# Patient Record
Sex: Female | Born: 1955 | Race: White | Hispanic: No | Marital: Married | Smoking: Never smoker
Health system: Southern US, Community
[De-identification: ages and names within clinical notes are randomized; demographics above are authoritative.]

## PROBLEM LIST (undated history)

## (undated) DIAGNOSIS — C50919 Malignant neoplasm of unspecified site of unspecified female breast: Secondary | ICD-10-CM

## (undated) DIAGNOSIS — I499 Cardiac arrhythmia, unspecified: Secondary | ICD-10-CM

## (undated) DIAGNOSIS — E039 Hypothyroidism, unspecified: Secondary | ICD-10-CM

## (undated) DIAGNOSIS — Z923 Personal history of irradiation: Secondary | ICD-10-CM

## (undated) DIAGNOSIS — K219 Gastro-esophageal reflux disease without esophagitis: Secondary | ICD-10-CM

## (undated) DIAGNOSIS — K589 Irritable bowel syndrome without diarrhea: Secondary | ICD-10-CM

## (undated) DIAGNOSIS — Z8719 Personal history of other diseases of the digestive system: Secondary | ICD-10-CM

## (undated) DIAGNOSIS — Z8639 Personal history of other endocrine, nutritional and metabolic disease: Secondary | ICD-10-CM

## (undated) DIAGNOSIS — Z8619 Personal history of other infectious and parasitic diseases: Secondary | ICD-10-CM

## (undated) DIAGNOSIS — C801 Malignant (primary) neoplasm, unspecified: Secondary | ICD-10-CM

## (undated) DIAGNOSIS — Z9221 Personal history of antineoplastic chemotherapy: Secondary | ICD-10-CM

## (undated) HISTORY — PX: BREAST EXCISIONAL BIOPSY: SUR124

## (undated) HISTORY — PX: EYE SURGERY: SHX253

---

## 1998-10-23 ENCOUNTER — Other Ambulatory Visit: Admission: RE | Admit: 1998-10-23 | Discharge: 1998-10-23 | Payer: Self-pay | Admitting: Gynecology

## 1999-04-29 ENCOUNTER — Other Ambulatory Visit: Admission: RE | Admit: 1999-04-29 | Discharge: 1999-04-29 | Payer: Self-pay | Admitting: Gynecology

## 1999-07-28 HISTORY — PX: VAGINAL HYSTERECTOMY: SUR661

## 2000-05-27 ENCOUNTER — Other Ambulatory Visit: Admission: RE | Admit: 2000-05-27 | Discharge: 2000-05-27 | Payer: Self-pay | Admitting: Gynecology

## 2001-07-04 ENCOUNTER — Other Ambulatory Visit: Admission: RE | Admit: 2001-07-04 | Discharge: 2001-07-04 | Payer: Self-pay | Admitting: Gynecology

## 2002-11-13 ENCOUNTER — Other Ambulatory Visit: Admission: RE | Admit: 2002-11-13 | Discharge: 2002-11-13 | Payer: Self-pay | Admitting: Gynecology

## 2004-01-01 ENCOUNTER — Other Ambulatory Visit: Admission: RE | Admit: 2004-01-01 | Discharge: 2004-01-01 | Payer: Self-pay | Admitting: Gynecology

## 2005-05-19 ENCOUNTER — Other Ambulatory Visit: Admission: RE | Admit: 2005-05-19 | Discharge: 2005-05-19 | Payer: Self-pay | Admitting: Gynecology

## 2005-09-29 ENCOUNTER — Inpatient Hospital Stay (HOSPITAL_COMMUNITY): Admission: RE | Admit: 2005-09-29 | Discharge: 2005-10-01 | Payer: Self-pay | Admitting: Obstetrics and Gynecology

## 2005-09-29 ENCOUNTER — Encounter (INDEPENDENT_AMBULATORY_CARE_PROVIDER_SITE_OTHER): Payer: Self-pay | Admitting: Specialist

## 2005-10-08 ENCOUNTER — Observation Stay (HOSPITAL_COMMUNITY): Admission: RE | Admit: 2005-10-08 | Discharge: 2005-10-09 | Payer: Self-pay | Admitting: Obstetrics and Gynecology

## 2005-12-31 ENCOUNTER — Ambulatory Visit (HOSPITAL_BASED_OUTPATIENT_CLINIC_OR_DEPARTMENT_OTHER): Admission: RE | Admit: 2005-12-31 | Discharge: 2005-12-31 | Payer: Self-pay | Admitting: Plastic Surgery

## 2005-12-31 ENCOUNTER — Encounter (INDEPENDENT_AMBULATORY_CARE_PROVIDER_SITE_OTHER): Payer: Self-pay | Admitting: *Deleted

## 2006-05-21 ENCOUNTER — Encounter: Admission: RE | Admit: 2006-05-21 | Discharge: 2006-05-21 | Payer: Self-pay | Admitting: Internal Medicine

## 2006-07-27 DIAGNOSIS — C801 Malignant (primary) neoplasm, unspecified: Secondary | ICD-10-CM

## 2006-07-27 HISTORY — PX: BREAST LUMPECTOMY: SHX2

## 2006-07-27 HISTORY — DX: Malignant (primary) neoplasm, unspecified: C80.1

## 2006-08-17 ENCOUNTER — Other Ambulatory Visit: Admission: RE | Admit: 2006-08-17 | Discharge: 2006-08-17 | Payer: Self-pay | Admitting: Gynecology

## 2007-02-08 ENCOUNTER — Encounter: Admission: RE | Admit: 2007-02-08 | Discharge: 2007-02-08 | Payer: Self-pay | Admitting: Sports Medicine

## 2007-06-24 ENCOUNTER — Encounter (INDEPENDENT_AMBULATORY_CARE_PROVIDER_SITE_OTHER): Payer: Self-pay | Admitting: Diagnostic Radiology

## 2007-06-24 ENCOUNTER — Encounter: Admission: RE | Admit: 2007-06-24 | Discharge: 2007-06-24 | Payer: Self-pay | Admitting: Internal Medicine

## 2007-07-05 ENCOUNTER — Encounter: Admission: RE | Admit: 2007-07-05 | Discharge: 2007-07-05 | Payer: Self-pay | Admitting: Internal Medicine

## 2007-07-07 ENCOUNTER — Encounter: Admission: RE | Admit: 2007-07-07 | Discharge: 2007-07-07 | Payer: Self-pay | Admitting: Internal Medicine

## 2007-07-25 ENCOUNTER — Encounter: Admission: RE | Admit: 2007-07-25 | Discharge: 2007-07-25 | Payer: Self-pay | Admitting: General Surgery

## 2007-07-27 ENCOUNTER — Encounter (INDEPENDENT_AMBULATORY_CARE_PROVIDER_SITE_OTHER): Payer: Self-pay | Admitting: General Surgery

## 2007-07-27 ENCOUNTER — Encounter: Admission: RE | Admit: 2007-07-27 | Discharge: 2007-07-27 | Payer: Self-pay | Admitting: General Surgery

## 2007-07-27 ENCOUNTER — Ambulatory Visit (HOSPITAL_BASED_OUTPATIENT_CLINIC_OR_DEPARTMENT_OTHER): Admission: RE | Admit: 2007-07-27 | Discharge: 2007-07-27 | Payer: Self-pay | Admitting: General Surgery

## 2007-07-27 HISTORY — PX: BREAST LUMPECTOMY: SHX2

## 2007-08-02 ENCOUNTER — Ambulatory Visit: Payer: Self-pay | Admitting: Oncology

## 2007-08-10 LAB — CBC WITH DIFFERENTIAL/PLATELET
BASO%: 0.1 % (ref 0.0–2.0)
Eosinophils Absolute: 0.4 10*3/uL (ref 0.0–0.5)
HCT: 38.8 % (ref 34.8–46.6)
MCHC: 35.1 g/dL (ref 32.0–36.0)
MONO#: 0.7 10*3/uL (ref 0.1–0.9)
NEUT#: 4.8 10*3/uL (ref 1.5–6.5)
RBC: 4.08 10*6/uL (ref 3.70–5.32)
WBC: 8.8 10*3/uL (ref 3.9–10.0)
lymph#: 2.9 10*3/uL (ref 0.9–3.3)

## 2007-08-10 LAB — COMPREHENSIVE METABOLIC PANEL
ALT: 13 U/L (ref 0–35)
AST: 12 U/L (ref 0–37)
Albumin: 4.8 g/dL (ref 3.5–5.2)
Alkaline Phosphatase: 38 U/L — ABNORMAL LOW (ref 39–117)
BUN: 13 mg/dL (ref 6–23)
Creatinine, Ser: 0.81 mg/dL (ref 0.40–1.20)
Potassium: 3.9 mEq/L (ref 3.5–5.3)

## 2007-08-14 LAB — VITAMIN D PNL(25-HYDRXY+1,25-DIHY)-BLD: Vit D, 25-Hydroxy: 35 ng/mL (ref 30–89)

## 2007-08-17 ENCOUNTER — Encounter (HOSPITAL_COMMUNITY): Admission: RE | Admit: 2007-08-17 | Discharge: 2007-11-15 | Payer: Self-pay | Admitting: Oncology

## 2007-08-23 ENCOUNTER — Ambulatory Visit (HOSPITAL_COMMUNITY): Admission: RE | Admit: 2007-08-23 | Discharge: 2007-08-23 | Payer: Self-pay | Admitting: Oncology

## 2007-08-24 ENCOUNTER — Other Ambulatory Visit: Admission: RE | Admit: 2007-08-24 | Discharge: 2007-08-24 | Payer: Self-pay | Admitting: Gynecology

## 2007-09-09 LAB — COMPREHENSIVE METABOLIC PANEL
ALT: 15 U/L (ref 0–35)
Albumin: 4.3 g/dL (ref 3.5–5.2)
CO2: 22 mEq/L (ref 19–32)
Chloride: 108 mEq/L (ref 96–112)
Glucose, Bld: 90 mg/dL (ref 70–99)
Potassium: 4.5 mEq/L (ref 3.5–5.3)
Sodium: 140 mEq/L (ref 135–145)
Total Bilirubin: 0.4 mg/dL (ref 0.3–1.2)
Total Protein: 7 g/dL (ref 6.0–8.3)

## 2007-09-09 LAB — CBC WITH DIFFERENTIAL/PLATELET
BASO%: 0.7 % (ref 0.0–2.0)
Eosinophils Absolute: 0.3 10*3/uL (ref 0.0–0.5)
MCHC: 36.1 g/dL — ABNORMAL HIGH (ref 32.0–36.0)
MONO#: 0.6 10*3/uL (ref 0.1–0.9)
NEUT#: 5.2 10*3/uL (ref 1.5–6.5)
RBC: 4.29 10*6/uL (ref 3.70–5.32)
RDW: 9.8 % — ABNORMAL LOW (ref 11.3–14.5)
WBC: 8.2 10*3/uL (ref 3.9–10.0)
lymph#: 2.1 10*3/uL (ref 0.9–3.3)

## 2007-09-14 ENCOUNTER — Ambulatory Visit: Payer: Self-pay | Admitting: Oncology

## 2007-09-16 LAB — CBC WITH DIFFERENTIAL/PLATELET
BASO%: 0.3 % (ref 0.0–2.0)
Basophils Absolute: 0 10*3/uL (ref 0.0–0.1)
EOS%: 0.6 % (ref 0.0–7.0)
Eosinophils Absolute: 0.1 10*3/uL (ref 0.0–0.5)
HCT: 39.6 % (ref 34.8–46.6)
HGB: 14 g/dL (ref 11.6–15.9)
LYMPH%: 20.7 % (ref 14.0–48.0)
MCH: 33.3 pg (ref 26.0–34.0)
MCHC: 35.4 g/dL (ref 32.0–36.0)
MCV: 94.1 fL (ref 81.0–101.0)
MONO#: 0.9 10*3/uL (ref 0.1–0.9)
MONO%: 6.3 % (ref 0.0–13.0)
NEUT#: 10.2 10*3/uL — ABNORMAL HIGH (ref 1.5–6.5)
NEUT%: 72.1 % (ref 39.6–76.8)
Platelets: 173 10*3/uL (ref 145–400)
RBC: 4.21 10*6/uL (ref 3.70–5.32)
RDW: 12.5 % (ref 11.3–14.5)
WBC: 14.1 10*3/uL — ABNORMAL HIGH (ref 3.9–10.0)
lymph#: 2.9 10*3/uL (ref 0.9–3.3)

## 2007-09-30 LAB — COMPREHENSIVE METABOLIC PANEL
AST: 18 U/L (ref 0–37)
Alkaline Phosphatase: 43 U/L (ref 39–117)
BUN: 13 mg/dL (ref 6–23)
Calcium: 8.9 mg/dL (ref 8.4–10.5)
Chloride: 106 mEq/L (ref 96–112)
Creatinine, Ser: 0.87 mg/dL (ref 0.40–1.20)

## 2007-09-30 LAB — CBC WITH DIFFERENTIAL/PLATELET
Basophils Absolute: 0.1 10*3/uL (ref 0.0–0.1)
EOS%: 7 % (ref 0.0–7.0)
HCT: 40.6 % (ref 34.8–46.6)
HGB: 14.5 g/dL (ref 11.6–15.9)
MCH: 33.4 pg (ref 26.0–34.0)
MCV: 93.6 fL (ref 81.0–101.0)
MONO%: 10 % (ref 0.0–13.0)
NEUT%: 51 % (ref 39.6–76.8)

## 2007-10-10 LAB — CBC WITH DIFFERENTIAL/PLATELET
BASO%: 0.1 % (ref 0.0–2.0)
Eosinophils Absolute: 0.2 10*3/uL (ref 0.0–0.5)
LYMPH%: 11.6 % — ABNORMAL LOW (ref 14.0–48.0)
MCHC: 33.8 g/dL (ref 32.0–36.0)
MCV: 96.3 fL (ref 81.0–101.0)
MONO%: 0.8 % (ref 0.0–13.0)
NEUT#: 32.6 10*3/uL — ABNORMAL HIGH (ref 1.5–6.5)
Platelets: 182 10*3/uL (ref 145–400)
RBC: 3.97 10*6/uL (ref 3.70–5.32)
RDW: 12.3 % (ref 11.3–14.5)
WBC: 37.5 10*3/uL — ABNORMAL HIGH (ref 3.9–10.0)

## 2007-10-21 ENCOUNTER — Ambulatory Visit: Payer: Self-pay | Admitting: Oncology

## 2007-10-28 LAB — CBC WITH DIFFERENTIAL/PLATELET
Eosinophils Absolute: 0.3 10*3/uL (ref 0.0–0.5)
HCT: 34.4 % — ABNORMAL LOW (ref 34.8–46.6)
LYMPH%: 18.6 % (ref 14.0–48.0)
MCV: 94.4 fL (ref 81.0–101.0)
MONO#: 1.6 10*3/uL — ABNORMAL HIGH (ref 0.1–0.9)
MONO%: 14.3 % — ABNORMAL HIGH (ref 0.0–13.0)
NEUT#: 7.3 10*3/uL — ABNORMAL HIGH (ref 1.5–6.5)
NEUT%: 64.5 % (ref 39.6–76.8)
Platelets: 151 10*3/uL (ref 145–400)
RBC: 3.64 10*6/uL — ABNORMAL LOW (ref 3.70–5.32)
WBC: 11.3 10*3/uL — ABNORMAL HIGH (ref 3.9–10.0)

## 2007-11-11 LAB — COMPREHENSIVE METABOLIC PANEL
Alkaline Phosphatase: 48 U/L (ref 39–117)
BUN: 12 mg/dL (ref 6–23)
Glucose, Bld: 85 mg/dL (ref 70–99)
Sodium: 141 mEq/L (ref 135–145)
Total Bilirubin: 0.4 mg/dL (ref 0.3–1.2)
Total Protein: 7.1 g/dL (ref 6.0–8.3)

## 2007-11-11 LAB — CBC WITH DIFFERENTIAL/PLATELET
BASO%: 1 % (ref 0.0–2.0)
EOS%: 4 % (ref 0.0–7.0)
HCT: 36.9 % (ref 34.8–46.6)
LYMPH%: 21 % (ref 14.0–48.0)
MCH: 32.8 pg (ref 26.0–34.0)
MCHC: 34.3 g/dL (ref 32.0–36.0)
MCV: 95.4 fL (ref 81.0–101.0)
MONO#: 0.7 10*3/uL (ref 0.1–0.9)
MONO%: 9.9 % (ref 0.0–13.0)
NEUT%: 64.2 % (ref 39.6–76.8)
Platelets: 234 10*3/uL (ref 145–400)
RBC: 3.86 10*6/uL (ref 3.70–5.32)
WBC: 6.9 10*3/uL (ref 3.9–10.0)

## 2007-11-15 ENCOUNTER — Ambulatory Visit: Admission: RE | Admit: 2007-11-15 | Discharge: 2008-02-05 | Payer: Self-pay | Admitting: Radiation Oncology

## 2007-11-21 LAB — CBC WITH DIFFERENTIAL/PLATELET
Basophils Absolute: 0.1 10*3/uL (ref 0.0–0.1)
Eosinophils Absolute: 0.1 10*3/uL (ref 0.0–0.5)
HGB: 12 g/dL (ref 11.6–15.9)
LYMPH%: 15.2 % (ref 14.0–48.0)
MCV: 97.5 fL (ref 81.0–101.0)
MONO%: 7.5 % (ref 0.0–13.0)
NEUT#: 14.6 10*3/uL — ABNORMAL HIGH (ref 1.5–6.5)
Platelets: 224 10*3/uL (ref 145–400)

## 2008-01-10 ENCOUNTER — Encounter: Admission: RE | Admit: 2008-01-10 | Discharge: 2008-01-10 | Payer: Self-pay | Admitting: Internal Medicine

## 2008-02-15 ENCOUNTER — Ambulatory Visit: Payer: Self-pay | Admitting: Oncology

## 2008-02-20 LAB — CBC WITH DIFFERENTIAL/PLATELET
Basophils Absolute: 0 10*3/uL (ref 0.0–0.1)
Eosinophils Absolute: 0.3 10*3/uL (ref 0.0–0.5)
HGB: 13.4 g/dL (ref 11.6–15.9)
LYMPH%: 31.6 % (ref 14.0–48.0)
MCV: 93.5 fL (ref 81.0–101.0)
MONO#: 0.5 10*3/uL (ref 0.1–0.9)
MONO%: 10.4 % (ref 0.0–13.0)
NEUT#: 2.7 10*3/uL (ref 1.5–6.5)
Platelets: 191 10*3/uL (ref 145–400)
RBC: 4.12 10*6/uL (ref 3.70–5.32)
RDW: 11.3 % (ref 11.3–14.5)
WBC: 5.2 10*3/uL (ref 3.9–10.0)

## 2008-03-22 LAB — CBC WITH DIFFERENTIAL/PLATELET
Basophils Absolute: 0 10*3/uL (ref 0.0–0.1)
Eosinophils Absolute: 0.3 10*3/uL (ref 0.0–0.5)
HGB: 13.2 g/dL (ref 11.6–15.9)
NEUT#: 2.7 10*3/uL (ref 1.5–6.5)
RBC: 4.02 10*6/uL (ref 3.70–5.32)
RDW: 12.2 % (ref 11.3–14.5)
WBC: 5.3 10*3/uL (ref 3.9–10.0)
lymph#: 1.8 10*3/uL (ref 0.9–3.3)

## 2008-05-24 ENCOUNTER — Ambulatory Visit: Payer: Self-pay | Admitting: Oncology

## 2008-05-29 LAB — CBC WITH DIFFERENTIAL/PLATELET
Basophils Absolute: 0 10*3/uL (ref 0.0–0.1)
EOS%: 3 % (ref 0.0–7.0)
HCT: 38.3 % (ref 34.8–46.6)
HGB: 13.3 g/dL (ref 11.6–15.9)
LYMPH%: 31.4 % (ref 14.0–48.0)
MCH: 33.4 pg (ref 26.0–34.0)
MCHC: 34.8 g/dL (ref 32.0–36.0)
MCV: 96.1 fL (ref 81.0–101.0)
MONO%: 8 % (ref 0.0–13.0)
NEUT%: 57.5 % (ref 39.6–76.8)

## 2008-05-29 LAB — COMPREHENSIVE METABOLIC PANEL
Albumin: 4 g/dL (ref 3.5–5.2)
BUN: 12 mg/dL (ref 6–23)
CO2: 31 mEq/L (ref 19–32)
Calcium: 9.3 mg/dL (ref 8.4–10.5)
Chloride: 105 mEq/L (ref 96–112)
Glucose, Bld: 103 mg/dL — ABNORMAL HIGH (ref 70–99)
Potassium: 4 mEq/L (ref 3.5–5.3)
Total Protein: 6.8 g/dL (ref 6.0–8.3)

## 2008-06-10 ENCOUNTER — Ambulatory Visit (HOSPITAL_COMMUNITY): Admission: RE | Admit: 2008-06-10 | Discharge: 2008-06-10 | Payer: Self-pay | Admitting: Oncology

## 2008-07-02 ENCOUNTER — Encounter: Admission: RE | Admit: 2008-07-02 | Discharge: 2008-07-02 | Payer: Self-pay | Admitting: General Surgery

## 2008-10-08 IMAGING — CT CT CHEST W/ CM
2 of 4 series · 15 of 36 positions shown, 18 images · IV contrast (omnipaque)
Comparison: none

CLINICAL DATA: Staging for breast CA. History of contrast allergy. Reportedly the patient was given 50 mg of Benadryl prior to the exam.
CHEST CT WITH CONTRAST:
TECHNIQUE: Multidetector CT imaging of the chest was performed following the standard protocol during bolus administration of intravenous contrast.
Contrast:  80cc Omnipaque 300

[Series 2: chest routine 5.0 b40f · axial · 0.68mm/px · z∈[-294,-34]mm · 12 of 62 slices shown, 15 images]
[im 5/62  mediastinal]
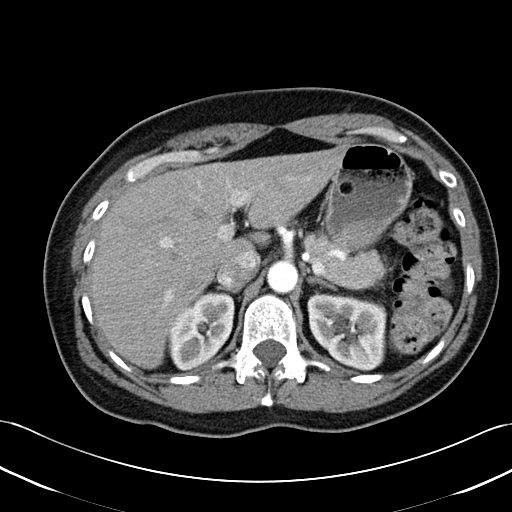
[im 5/62  lung]
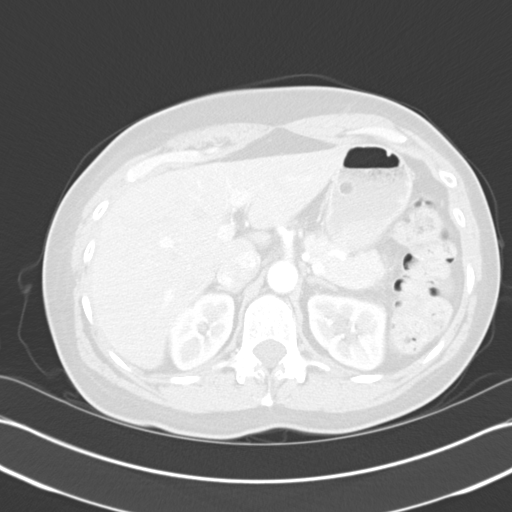
[im 9/62  lung]
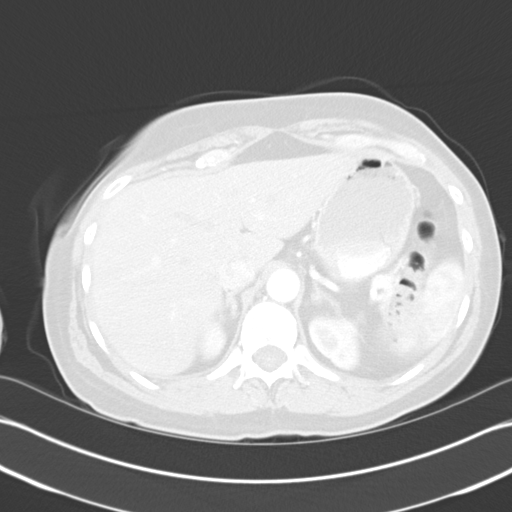
[im 14/62  lung]
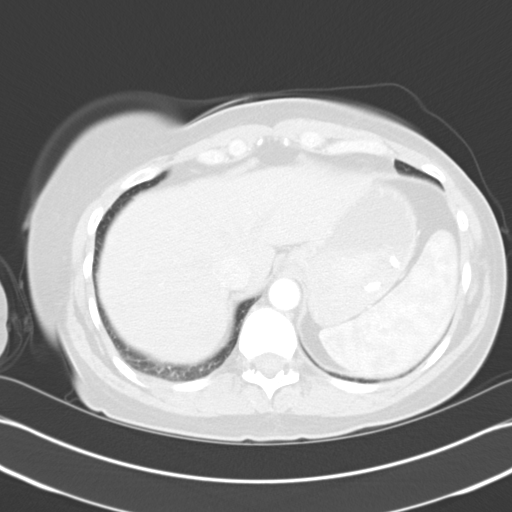
[im 18/62  lung]
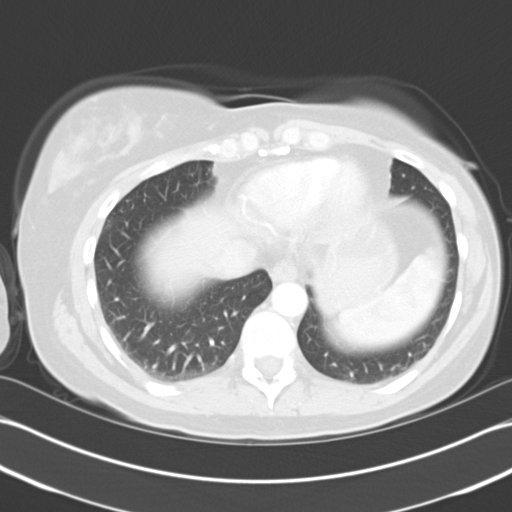
[im 22/62  mediastinal]
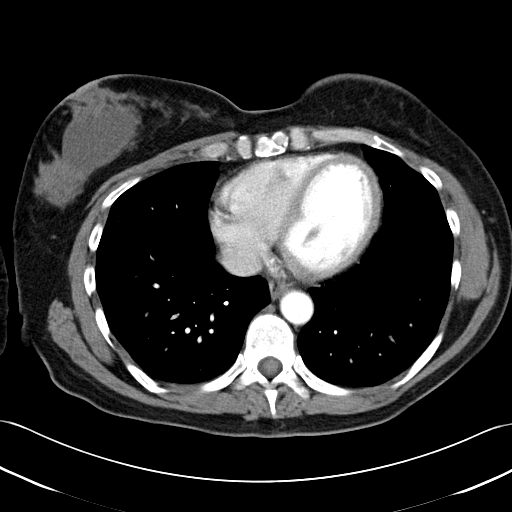
[im 22/62  lung]
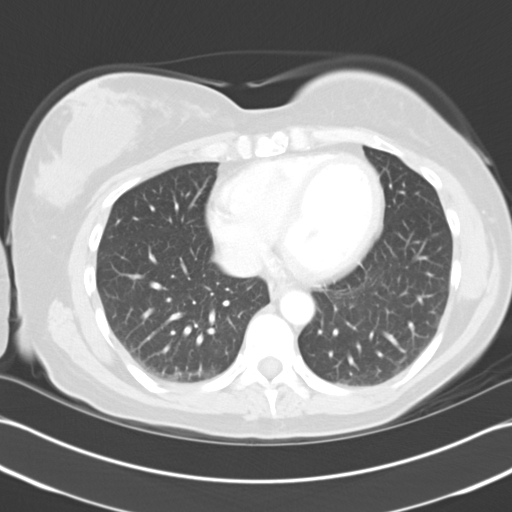
[im 27/62  lung]
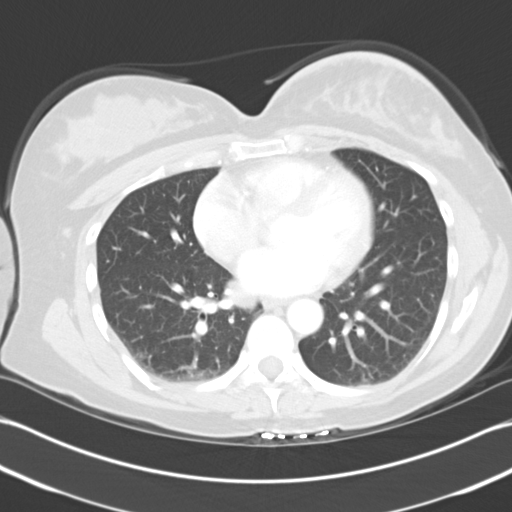
[im 35/62  lung]
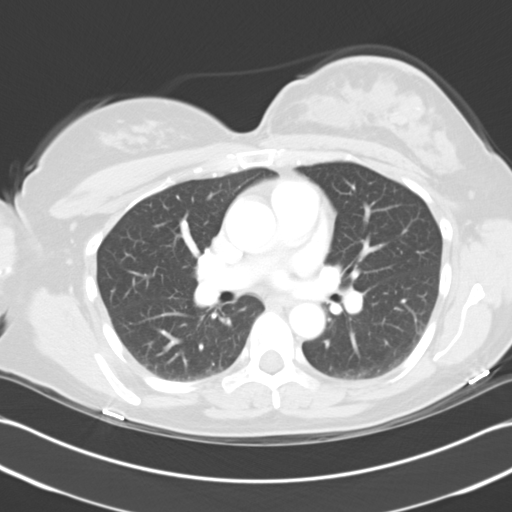
[im 40/62  lung]
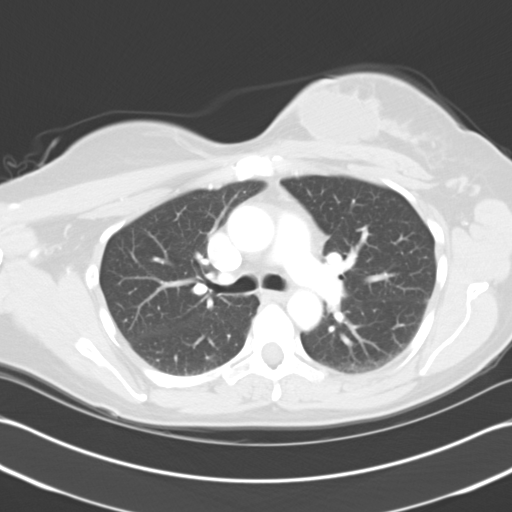
[im 44/62  mediastinal]
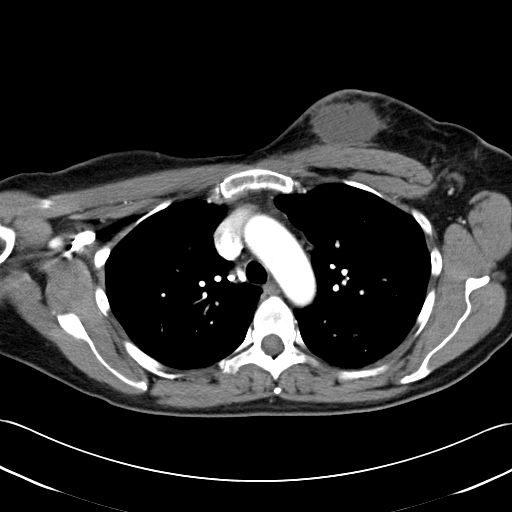
[im 44/62  lung]
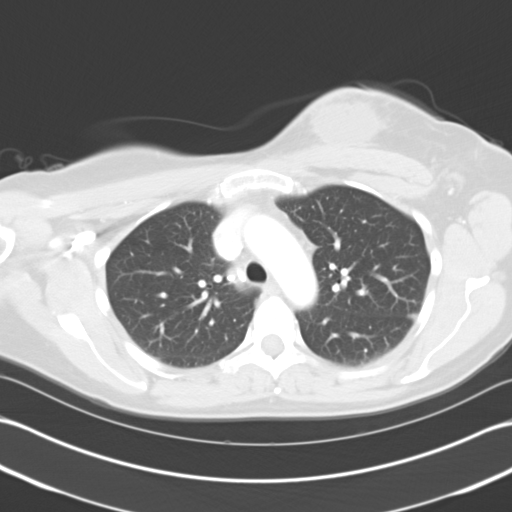
[im 48/62  lung]
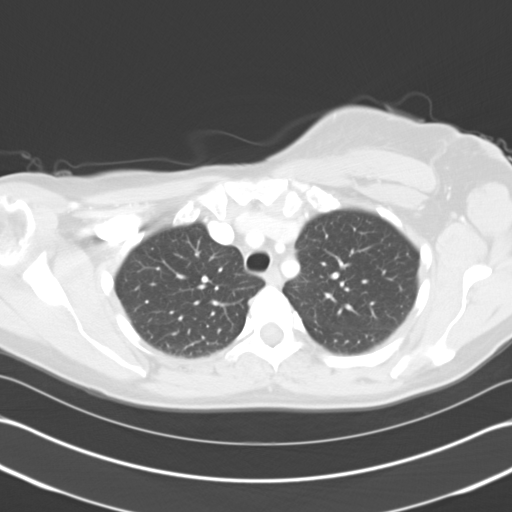
[im 53/62  lung]
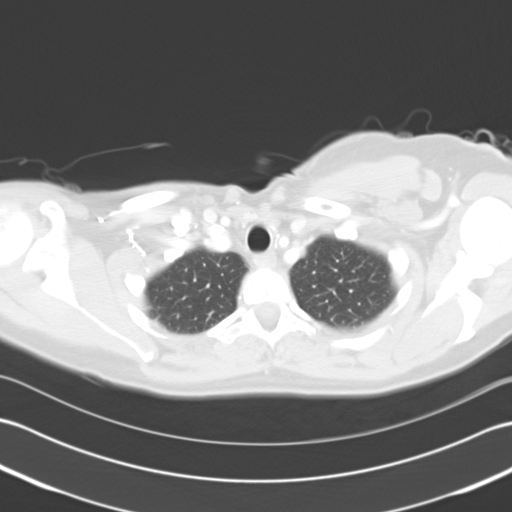
[im 57/62  lung]
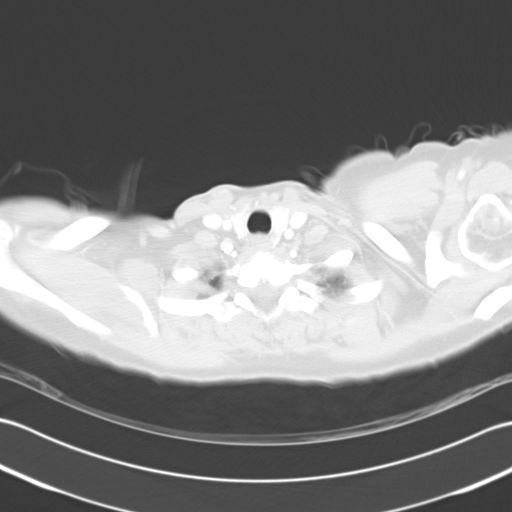

[Series 602: <mpr thick range> · coronal · 0.68mm/px · 3 of 71 slices shown]
[im 15/71  lung]
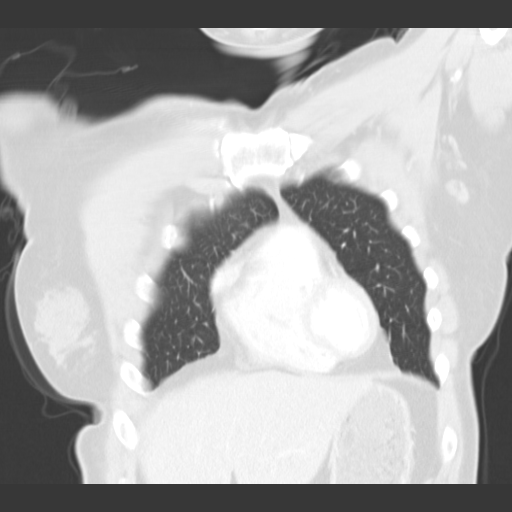
[im 29/71  lung]
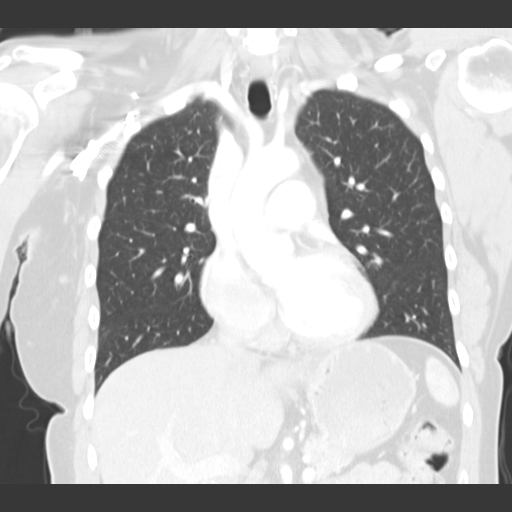
[im 43/71  lung]
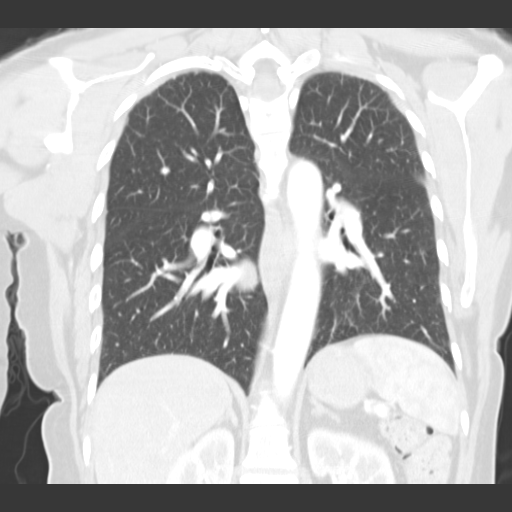

[15 of 36 positions shown; findings below may reference images not displayed]

FINDINGS: There is a well-defined fluid collection in the left breast.  It measures approximately 3.3 x 5.8 x 5.9 cm.  I suspect that it represents a seroma.  There are multiple or multiloculated fluid collections in the right breast, the largest measuring 3.9 x 5.2 cm.  These may represent breast cysts.  Thickening of the skin of the left breast.  No pathologically enlarged mediastinal, hilar, or axillary lymph nodes.  No pulmonary nodules or masses. No pleural effusions.  No evidence of bony metastatic disease. Adrenal glands appear unremarkable.
IMPRESSION: No evidence of metastatic involvement of the chest. Medial left breast fluid-filled mass is noted and I suspect that it represents a seroma. Right breast fluid-filled masses, likely cysts.

## 2008-11-15 ENCOUNTER — Ambulatory Visit: Payer: Self-pay | Admitting: Oncology

## 2008-11-19 LAB — COMPREHENSIVE METABOLIC PANEL
ALT: 19 U/L (ref 0–35)
Albumin: 4.2 g/dL (ref 3.5–5.2)
Alkaline Phosphatase: 41 U/L (ref 39–117)
CO2: 26 mEq/L (ref 19–32)
Potassium: 4.3 mEq/L (ref 3.5–5.3)
Sodium: 141 mEq/L (ref 135–145)
Total Bilirubin: 0.3 mg/dL (ref 0.3–1.2)
Total Protein: 6.7 g/dL (ref 6.0–8.3)

## 2008-11-19 LAB — CBC WITH DIFFERENTIAL/PLATELET
Eosinophils Absolute: 0.2 10*3/uL (ref 0.0–0.5)
HGB: 12.9 g/dL (ref 11.6–15.9)
MCV: 94.8 fL (ref 79.5–101.0)
MONO%: 8.8 % (ref 0.0–14.0)
NEUT#: 2.8 10*3/uL (ref 1.5–6.5)
RBC: 3.91 10*6/uL (ref 3.70–5.45)
RDW: 11.9 % (ref 11.2–14.5)
WBC: 5.3 10*3/uL (ref 3.9–10.3)
lymph#: 1.8 10*3/uL (ref 0.9–3.3)

## 2008-11-19 LAB — CANCER ANTIGEN 27.29: CA 27.29: 22 U/mL (ref 0–39)

## 2008-11-19 LAB — RESEARCH LABS

## 2009-05-31 ENCOUNTER — Ambulatory Visit: Payer: Self-pay | Admitting: Oncology

## 2009-06-04 LAB — COMPREHENSIVE METABOLIC PANEL
Alkaline Phosphatase: 35 U/L — ABNORMAL LOW (ref 39–117)
Glucose, Bld: 98 mg/dL (ref 70–99)
Sodium: 139 mEq/L (ref 135–145)
Total Bilirubin: 0.6 mg/dL (ref 0.3–1.2)
Total Protein: 7.2 g/dL (ref 6.0–8.3)

## 2009-06-04 LAB — CBC WITH DIFFERENTIAL/PLATELET
BASO%: 0.2 % (ref 0.0–2.0)
Eosinophils Absolute: 0.2 10*3/uL (ref 0.0–0.5)
LYMPH%: 35.1 % (ref 14.0–49.7)
MCHC: 34.2 g/dL (ref 31.5–36.0)
MCV: 97.3 fL (ref 79.5–101.0)
MONO%: 9.5 % (ref 0.0–14.0)
Platelets: 202 10*3/uL (ref 145–400)
RBC: 4.07 10*6/uL (ref 3.70–5.45)

## 2009-06-04 LAB — FOLLICLE STIMULATING HORMONE: FSH: 31.7 m[IU]/mL

## 2009-06-13 LAB — ESTRADIOL, ULTRA SENS: Estradiol, Ultra Sensitive: 9 pg/mL

## 2009-07-09 ENCOUNTER — Encounter: Admission: RE | Admit: 2009-07-09 | Discharge: 2009-07-09 | Payer: Self-pay | Admitting: General Surgery

## 2009-12-04 ENCOUNTER — Ambulatory Visit: Payer: Self-pay | Admitting: Oncology

## 2009-12-05 LAB — COMPREHENSIVE METABOLIC PANEL
ALT: 25 U/L (ref 0–35)
AST: 22 U/L (ref 0–37)
Albumin: 3.9 g/dL (ref 3.5–5.2)
Alkaline Phosphatase: 29 U/L — ABNORMAL LOW (ref 39–117)
BUN: 12 mg/dL (ref 6–23)
CO2: 29 mEq/L (ref 19–32)
Calcium: 8.7 mg/dL (ref 8.4–10.5)
Chloride: 106 mEq/L (ref 96–112)
Creatinine, Ser: 0.94 mg/dL (ref 0.40–1.20)
Glucose, Bld: 96 mg/dL (ref 70–99)
Potassium: 4 mEq/L (ref 3.5–5.3)
Sodium: 140 mEq/L (ref 135–145)
Total Bilirubin: 0.3 mg/dL (ref 0.3–1.2)
Total Protein: 7 g/dL (ref 6.0–8.3)

## 2009-12-05 LAB — CBC WITH DIFFERENTIAL/PLATELET
Basophils Absolute: 0 10*3/uL (ref 0.0–0.1)
HCT: 38.9 % (ref 34.8–46.6)
HGB: 13.4 g/dL (ref 11.6–15.9)
LYMPH%: 21.9 % (ref 14.0–49.7)
MCH: 33.1 pg (ref 25.1–34.0)
MONO#: 0.6 10*3/uL (ref 0.1–0.9)
NEUT%: 67.1 % (ref 38.4–76.8)
Platelets: 160 10*3/uL (ref 145–400)
WBC: 7.1 10*3/uL (ref 3.9–10.3)
lymph#: 1.6 10*3/uL (ref 0.9–3.3)

## 2009-12-05 LAB — CANCER ANTIGEN 27.29: CA 27.29: 22 U/mL (ref 0–39)

## 2010-07-11 ENCOUNTER — Encounter
Admission: RE | Admit: 2010-07-11 | Discharge: 2010-07-11 | Payer: Self-pay | Source: Home / Self Care | Attending: Internal Medicine | Admitting: Internal Medicine

## 2010-07-27 HISTORY — PX: ABDOMINAL SACROCOLPOPEXY: SHX1114

## 2010-07-27 HISTORY — PX: OVARY SURGERY: SHX727

## 2010-12-09 NOTE — Op Note (Signed)
Jacqueline Schroeder, Jacqueline Schroeder           ACCOUNT NO.:  [id number]   MEDICAL RECORD NO.:  [id number]          PATIENT TYPE:  AMB   LOCATION:  DSC                          FACILITY:  [facility]   PHYSICIAN:  Angelia Mould. Derrell Lolling, M.D.DATE OF BIRTH:  10-05-1955   DATE OF PROCEDURE:  07/27/2007  DATE OF DISCHARGE:                               OPERATIVE REPORT   PREOPERATIVE DIAGNOSIS:  1. Invasive mammary carcinoma left breast, upper inner quadrant.  2. A 5 mm nevus presternal area.   POSTOPERATIVE DIAGNOSIS:  1. Invasive mammary carcinoma left breast, upper inner quadrant.  2. A 5 mm nevus presternal area.   OPERATION PERFORMED:  1. Injection blue dye left breast.  2. Left partial mastectomy with needle localization in the specimen      mammogram.  3. Re-excision superficial medial inferior margin of lumpectomy      specimen.  4. Re-excision superficial medial superior margin of lumpectomy      specimen.  5. Left axillary sentinel node biopsy.  6. Excision 5 mm nevus presternal area.   SURGEON:  Angelia Mould. Derrell Lolling, M.D.   OPERATIVE INDICATIONS:  This is a 55 year old white female who recently  felt a lump in the upper inner quadrant of her left breast.  Imaging  studies suggested a 1.5-2.0 cm mass at the 11 o'clock position in the  left breast.  Core biopsy of this area showed invasive mammary  carcinoma.  MRI showed this to be a solitary finding.  She also has a 5  mm nevus in the presternal area which looks probably benign, but she  wanted this excised.  After lengthy discussion as an outpatient, we  elected breast conservation surgery.   OPERATIVE TECHNIQUE:  The patient underwent needle localization of her  left breast cancer, upper inner quadrant at [facility] this morning.  The wire was inserted in the very medial  location, quite obliquely.  She was brought to [facility]  where she underwent injection of radionuclide into her subareolar area.  She  was taken to the operating room.  General anesthesia was induced.  The patient was identified as correct patient, correct procedure, and  correct site.  After an alcohol prep I injected 5 mL of blue dye in the  left retroareolar area consisting of 2 mL of methylene blue in 3 mL of  saline.  The breast was massaged for 4 minutes.  The anterior chest wall  and breast and axilla were then prepped and draped in a sterile fashion.   After triangulating the wire with the images, I marked a curved  circumareolar incision in the upper inner quadrant of the left breast  several centimeters peripheral to the areolar margin, and near the wire;  the incision was made.  Dissection was carried down into the breast  tissue, and widely around what turned out to be a palpable mass.  I took  this all the way down to the pectoralis fascia, as it was a fairly thin  area and I wanted to be sure I had a deep margin.  I felt that I came  close, medially; and so after removing the specimen, I marked the  specimen with silk sutures to orient the pathologist; and sent the  specimen to radiology.   After this I excised superficial superior-medial margin and re-excised a  superficial superior-inferior margin and labeled that with silk sutures  as well, and discussed it with the pathologist.   The specimen mammogram showed that the tumor was within the specimen,  but fairly close to the superficial margin.  Dr. Colonel Bald in pathology felt  that the tumor was close to the medial margin, but given the fact that  we had re-excised all of the medial margin both superiorly and  inferiorly, that we had good margins.  The wound was packed.  Then using  the NeoProbe, I isolated the area of increased activity in the left  axilla.  A transverse incision was made.  Dissection was carried down  through the subcutaneous tissue until I entered the axilla.  We had one  small arterial vessel that had to be isolated, clamped, divided,  and  ligated with 2-0 silk ties.  Using the NeoProbe we dissected out 2 very  hot, very blue lymph nodes.  These were both sent to pathology.  Dr.  Colonel Bald evaluated these and felt that they were negative by imprint  cytology.   Both the breast wound and the axillary wound were irrigated with saline.  Both of them were infiltrated with 0.5% Marcaine with epinephrine.  In  the axilla the subcutaneous tissue was closed with interrupted sutures  of 3-0 Vicryl, and the skin closed with a running subcuticular suture of  4-0 Monocryl and Steri-Strips.  In the left breast tissue, I closed the  more superficial subcutaneous tissue with interrupted sutures of 3-0  Vicryl; and the skin with a running subcuticular suture of 4-0 Monocryl.  Both of these wounds were covered with Dermabond.  I did not use Steri-  Strips.   The nevus and presternal area was identified.  I made a vertically  oriented elliptical incision and conservatively excised this, and sent  it for routine histology.  Hemostasis was excellent, and achieved with  electrocautery.  The wound was closed with a running subcuticular suture  of 4-0 Monocryl and Dermabond.  Clean bandages were placed, and the  patient taken to recovery room in stable condition.  Sponge, needle, and  instrument counts were correct.      Angelia Mould. Derrell Lolling, M.D.  Electronically Signed     HMI/MEDQ  D:  07/27/2007  T:  07/27/2007  Job:  [id number]   cc:   Windell Moulding Mezer, M.D.

## 2010-12-12 NOTE — H&P (Signed)
NAME:  Jacqueline Schroeder, Jacqueline Schroeder           ACCOUNT NO.:  [id number]   MEDICAL RECORD NO.:  [id number]          PATIENT TYPE:  AMB   LOCATION:  SDC                           FACILITY:  [facility]   PHYSICIAN:  Randye Lobo, M.D.   DATE OF BIRTH:  1955/11/28   DATE OF ADMISSION:  DATE OF DISCHARGE:                                HISTORY & PHYSICAL   CHIEF COMPLAINT:  Abnormal and heavy menstrual cycles and urinary  incontinence.   HISTORY OF PRESENT ILLNESS:  The patient is a 55 year old para 2 Caucasian  female who is referred by Dr. Teodora Medici following evaluation of abnormal  menstrual cycles.  The patient has undergone an endometrial biopsy in  October of 2006, documenting complex hyperplasia without atypia.  The  patient also has a history of urinary incontinence and would like definitive  treatment of the hyperplasia and the urinary incontinence.   The patient's urinary symptoms include leakage of urine if she has a bad  cold or if she exercises.  Her urinary incontinence does limit her physical  activity and she reports that this is a social problem for her.  The patient  has undergone simple cystometrics at the office at which time her bladder  was filled to 500 cc with sterile saline and no stress incontinence was  noted.  The patient, however, reported that this did not duplicate her usual  symptoms of leakage.   PAST OBSTETRIC AND GYNECOLOGIC HISTORY:  The patient is status post vaginal  delivery x2.  Her last Pap smear was performed in October of 2006 and was  within normal limits.  Her last mammogram was performed in September or  October of 2006 and was also within normal limits.   PAST MEDICAL HISTORY:  1.  Graves' disease.  The patient does have a history of atrial      fibrillation, new onset which was likely secondary to thyroid storm.      The patient has undergone radioactive iodine ablative therapy which had      given her a diagnosis of hypothyroidism.  Of note,  recently the patient      was noted to have recurrence of her hyperthyroidism in December of 2006      and she has been cared for by her primary care Cadee Agro who has now      brought her thyroid function back into normal range.  She is currently      only taking Propranolol.  2.  History of moderate pulmonary hypertension by echocardiogram.   PAST SURGICAL HISTORY:  Status post hysteroscopy/D&C.   MEDICATIONS:  Propranolol 40 mg p.o. b.i.d.   ALLERGIES:  No known drug allergies.   SOCIAL HISTORY:  The patient has been married for 24 years.  She works as a  Programmer, multimedia for [employer].  She denies the use of tobacco or illicit drugs.  She reports the use of alcohol and drinks two beverages per weekend.   REVIEW OF SYSTEMS:  The patient denies any history of fecal incontinence.  She also denies constipation.   PHYSICAL EXAMINATION:  HEENT:  Normocephalic, atraumatic.  LUNGS:  Clear to auscultation bilaterally.  HEART:  S1/S2 with a regular rate and rhythm.  ABDOMEN:  Soft and nontender and without evidence of hepatosplenomegaly or  organomegaly.  PELVIC:  Normal external genitalia and urethra.  There is evidence of a  second degree cystocele, first degree uterine prolapse, and a first degree  rectocele.  The uterus is small and nontender.  There is no evidence of any  adnexal mass or tenderness appreciated.  The anus is without lesions.   IMPRESSION:  The patient is a 55 year old para 2 female with complex  endometrial hyperplasia, pelvic organ prolapse, and occult genuine stress  incontinence.  The patient is currently euthyroid.   PLAN:  The patient will undergo a total vaginal hysterectomy with anterior  colporrhaphy, tension free vaginal tape and cystoscopy on September 29, 2005 at  the [facility] [place].  The patient declines salpingo-  oophorectomy.  Risks, benefits, and alternatives to surgery have been  discussed with the patient who wishes to proceed.       Randye Lobo, M.D.  Electronically Signed     BES/MEDQ  D:  09/28/2005  T:  09/28/2005  Job:  [id number]

## 2010-12-12 NOTE — Discharge Summary (Signed)
NAMEOAKLYN, JAKUBEK           ACCOUNT NO.:  [id number]   MEDICAL RECORD NO.:  [id number]          PATIENT TYPE:  INP   LOCATION:  9319                          FACILITY:  [facility]   PHYSICIAN:  Randye Lobo, M.D.   DATE OF BIRTH:  1956-05-22   DATE OF ADMISSION:  09/29/2005  DATE OF DISCHARGE:  10/01/2005                                 DISCHARGE SUMMARY   ADMISSION DIAGNOSES:  1.  Complex endometrial hyperplasia.  2.  Incomplete uterovaginal prolapse.  3.  Occult genuine stress incontinence.   DISCHARGE DIAGNOSES:  1.  Complex endometrial hyperplasia without atypia.  2.  Incomplete uterovaginal prolapse.  3.  Occult genuine stress incontinence.  4.  Status post total vaginal hysterectomy, McCall culdoplasty, anterior      colporrhaphy, tension-free vaginal tape, and cystoscopy.   OPERATIONS AND PROCEDURES:  The patient underwent a total vaginal  hysterectomy with Rogelio Seen culdoplasty, anterior colporrhaphy, tension-free  vaginal tape, suburethral sling and cystoscopy at the [facility]  [place] on September 29, 2005 under the direction of Dr. Conley Simmonds with the  assistance of Dr. Carrington Clamp.   ADMISSION HISTORY AND PHYSICAL EXAMINATION:  The patient is a 55 year old  para 2 Caucasian female who was referred by her primary gynecologist,  Dr.  Teodora Medici for treatment of a known diagnosis of complex endometrial  hyperplasia without atypia along with prolapse and urinary incontinence.  On  physical exam the patient was noted to have a second-degree cystocele, first-  degree uterine prolapse and a first degree rectocele.  The patient had  simple cystometric testing in the office which did not duplicate her  symptoms of stress incontinence which she was usually experiencing at home.   The patient wished for definitive surgical evaluation and treatment of the  hyperplasia, prolapse and incontinence and a plan was made to proceed with a  total vaginal hysterectomy and  anterior colporrhaphy along with a tension-  free vaginal tape and cystoscopy.   HOSPITAL COURSE:  The patient was admitted September 29, 2005 at which time she  underwent a total vaginal hysterectomy with Rogelio Seen culdoplasty, anterior  colporrhaphy, tension-free vaginal tape, suburethral sling and cystoscopy.  Findings at surgery documented second-degree uterine prolapse, a second-  degree cystocele, and a minimal rectocele.  The uterus was approximately 8  weeks size.  There were multiple tiny uterine fibroids within the  endometrial cavity.  The tubes and ovaries were unremarkable.  There was a  1.5 cm left corpus luteum cyst.  Cystoscopy documented the absence of a  foreign body in the urethra and in the bladder during the sling procedure.  The ureters were noted to be patent bilaterally.   The patient's surgery was uncomplicated.   POSTOPERATIVE COURSE:  The patient had good control of her pain  postoperatively with a morphine PCA.  By postoperative day number one, the  patient's PCA was discontinued and she was started on oral pain medication.  Her incisions remained intact.  The patient began her voiding trials on  postoperative day number one and required a Foley over night due to residual  volumes measuring 200-300  cc.  The patient had been voiding 200-300 cc  spontaneously.  By postoperative day number two, the patient was ambulating  independently.  She was tolerating a regular diet.  She was afebrile.  Her  Foley catheter was left to gravity drainage after she again had post void  residuals measuring between 200-250 cc.  Her final pathology report returned  prior to discharge documenting simple and complex endometrial hyperplasia  without atypia.  There was also evidence of adenomyosis and leiomyomata.  The cervix demonstrated cervicitis.   The patient's discharge hemoglobin was 11.8 and she was tolerating this  well.   The patient was found to be in good condition and ready  for discharge on  postoperative day number two.   DISCHARGE INSTRUCTIONS:  1.  Discharged to home.  2.  The patient will take the following medications:  Percocet 5/325 mg one      to two p.o. q.4-6 hours p.r.n. pain.  She will also take ibuprofen 600      mg p.o. q.6 h p.r.n. pain.  3.  The patient will follow a regular diet.  4.  The patient will have decreased activity for 6 weeks.   FOLLOW UP:  The patient will follow up in the office in 4 days for a voiding  trial.  The patient will call if she experiences problems with fever, nausea  and vomiting, pain uncontrolled by her medication, active vaginal bleeding,  problems with her Foley catheter, or any other concern.      Randye Lobo, M.D.  Electronically Signed     BES/MEDQ  D:  12/02/2005  T:  12/03/2005  Job:  [id number]

## 2010-12-15 ENCOUNTER — Other Ambulatory Visit: Payer: Self-pay | Admitting: Oncology

## 2010-12-15 ENCOUNTER — Encounter (HOSPITAL_BASED_OUTPATIENT_CLINIC_OR_DEPARTMENT_OTHER): Payer: BC Managed Care – PPO | Admitting: Oncology

## 2010-12-15 DIAGNOSIS — C50919 Malignant neoplasm of unspecified site of unspecified female breast: Secondary | ICD-10-CM

## 2010-12-15 DIAGNOSIS — Z170 Estrogen receptor positive status [ER+]: Secondary | ICD-10-CM

## 2010-12-15 LAB — COMPREHENSIVE METABOLIC PANEL
AST: 18 U/L (ref 0–37)
Albumin: 3.9 g/dL (ref 3.5–5.2)
BUN: 13 mg/dL (ref 6–23)
CO2: 28 mEq/L (ref 19–32)
Calcium: 9.2 mg/dL (ref 8.4–10.5)
Chloride: 105 mEq/L (ref 96–112)
Glucose, Bld: 105 mg/dL — ABNORMAL HIGH (ref 70–99)
Potassium: 3.8 mEq/L (ref 3.5–5.3)

## 2010-12-15 LAB — CBC WITH DIFFERENTIAL/PLATELET
BASO%: 0.4 % (ref 0.0–2.0)
Basophils Absolute: 0 10*3/uL (ref 0.0–0.1)
EOS%: 3.5 % (ref 0.0–7.0)
Eosinophils Absolute: 0.2 10*3/uL (ref 0.0–0.5)
HCT: 37.3 % (ref 34.8–46.6)
HGB: 12.8 g/dL (ref 11.6–15.9)
LYMPH%: 36 % (ref 14.0–49.7)
MCH: 33 pg (ref 25.1–34.0)
MCHC: 34.2 g/dL (ref 31.5–36.0)
MCV: 96.3 fL (ref 79.5–101.0)
MONO#: 0.5 10*3/uL (ref 0.1–0.9)
MONO%: 8.5 % (ref 0.0–14.0)
NEUT#: 2.9 10*3/uL (ref 1.5–6.5)
NEUT%: 51.6 % (ref 38.4–76.8)
Platelets: 163 10*3/uL (ref 145–400)
RBC: 3.88 10*6/uL (ref 3.70–5.45)
RDW: 12.4 % (ref 11.2–14.5)
WBC: 5.7 10*3/uL (ref 3.9–10.3)
lymph#: 2.1 10*3/uL (ref 0.9–3.3)

## 2010-12-16 LAB — CANCER ANTIGEN 27.29: CA 27.29: 17 U/mL (ref 0–39)

## 2010-12-16 LAB — VITAMIN D 25 HYDROXY (VIT D DEFICIENCY, FRACTURES): Vit D, 25-Hydroxy: 45 ng/mL (ref 30–89)

## 2010-12-16 LAB — FOLLICLE STIMULATING HORMONE: FSH: 30.9 m[IU]/mL

## 2010-12-29 ENCOUNTER — Other Ambulatory Visit: Payer: Self-pay | Admitting: Oncology

## 2010-12-29 ENCOUNTER — Encounter (HOSPITAL_BASED_OUTPATIENT_CLINIC_OR_DEPARTMENT_OTHER): Payer: BC Managed Care – PPO | Admitting: Oncology

## 2010-12-29 DIAGNOSIS — C50219 Malignant neoplasm of upper-inner quadrant of unspecified female breast: Secondary | ICD-10-CM

## 2010-12-29 DIAGNOSIS — Z170 Estrogen receptor positive status [ER+]: Secondary | ICD-10-CM

## 2010-12-29 DIAGNOSIS — C50919 Malignant neoplasm of unspecified site of unspecified female breast: Secondary | ICD-10-CM

## 2011-04-27 ENCOUNTER — Other Ambulatory Visit: Payer: Self-pay | Admitting: Oncology

## 2011-04-27 ENCOUNTER — Encounter (HOSPITAL_BASED_OUTPATIENT_CLINIC_OR_DEPARTMENT_OTHER): Payer: BC Managed Care – PPO | Admitting: Oncology

## 2011-04-27 DIAGNOSIS — C50219 Malignant neoplasm of upper-inner quadrant of unspecified female breast: Secondary | ICD-10-CM

## 2011-04-27 DIAGNOSIS — C50919 Malignant neoplasm of unspecified site of unspecified female breast: Secondary | ICD-10-CM

## 2011-04-27 DIAGNOSIS — Z170 Estrogen receptor positive status [ER+]: Secondary | ICD-10-CM

## 2011-04-27 LAB — CBC WITH DIFFERENTIAL/PLATELET
Basophils Absolute: 0 10*3/uL (ref 0.0–0.1)
HCT: 40.1 % (ref 34.8–46.6)
HGB: 14.1 g/dL (ref 11.6–15.9)
MONO#: 0.4 10*3/uL (ref 0.1–0.9)
NEUT%: 48.4 % (ref 38.4–76.8)
WBC: 5.3 10*3/uL (ref 3.9–10.3)
lymph#: 2.1 10*3/uL (ref 0.9–3.3)

## 2011-04-28 LAB — COMPREHENSIVE METABOLIC PANEL
ALT: 37 U/L — ABNORMAL HIGH (ref 0–35)
AST: 21 U/L (ref 0–37)
Albumin: 5 g/dL (ref 3.5–5.2)
Alkaline Phosphatase: 39 U/L (ref 39–117)
Glucose, Bld: 99 mg/dL (ref 70–99)
Potassium: 3.9 mEq/L (ref 3.5–5.3)
Sodium: 139 mEq/L (ref 135–145)
Total Protein: 7.3 g/dL (ref 6.0–8.3)

## 2011-04-28 LAB — VITAMIN D 25 HYDROXY (VIT D DEFICIENCY, FRACTURES): Vit D, 25-Hydroxy: 52 ng/mL (ref 30–89)

## 2011-04-30 ENCOUNTER — Ambulatory Visit
Admission: RE | Admit: 2011-04-30 | Discharge: 2011-04-30 | Disposition: A | Discharge status: Home / Self Care | Payer: BC Managed Care – PPO | Source: Ambulatory Visit | Attending: Oncology | Admitting: Oncology

## 2011-04-30 DIAGNOSIS — C50919 Malignant neoplasm of unspecified site of unspecified female breast: Secondary | ICD-10-CM

## 2011-05-01 LAB — URINALYSIS, ROUTINE W REFLEX MICROSCOPIC
Glucose, UA: NEGATIVE
Hgb urine dipstick: NEGATIVE
Ketones, ur: NEGATIVE
Protein, ur: NEGATIVE

## 2011-05-01 LAB — DIFFERENTIAL
Basophils Absolute: 0
Lymphocytes Relative: 31
Monocytes Absolute: 0.5
Neutro Abs: 4.1
Neutrophils Relative %: 57

## 2011-05-01 LAB — CBC
HCT: 40
MCV: 96.5
Platelets: 217
WBC: 7.2

## 2011-05-01 LAB — COMPREHENSIVE METABOLIC PANEL
Albumin: 3.7
BUN: 9
Chloride: 108
Creatinine, Ser: 0.85
GFR calc non Af Amer: >60
Total Bilirubin: 0.4

## 2011-05-13 ENCOUNTER — Encounter (HOSPITAL_BASED_OUTPATIENT_CLINIC_OR_DEPARTMENT_OTHER): Payer: BC Managed Care – PPO | Admitting: Oncology

## 2011-05-13 DIAGNOSIS — C50919 Malignant neoplasm of unspecified site of unspecified female breast: Secondary | ICD-10-CM

## 2011-05-13 DIAGNOSIS — Z170 Estrogen receptor positive status [ER+]: Secondary | ICD-10-CM

## 2011-05-13 DIAGNOSIS — M899 Disorder of bone, unspecified: Secondary | ICD-10-CM

## 2011-05-13 DIAGNOSIS — M949 Disorder of cartilage, unspecified: Secondary | ICD-10-CM

## 2011-07-07 ENCOUNTER — Other Ambulatory Visit: Payer: Self-pay | Admitting: Oncology

## 2011-07-07 DIAGNOSIS — Z853 Personal history of malignant neoplasm of breast: Secondary | ICD-10-CM

## 2011-07-30 ENCOUNTER — Ambulatory Visit
Admission: RE | Admit: 2011-07-30 | Discharge: 2011-07-30 | Disposition: A | Discharge status: Home / Self Care | Payer: BC Managed Care – PPO | Source: Ambulatory Visit | Attending: Oncology | Admitting: Oncology

## 2011-07-30 DIAGNOSIS — Z853 Personal history of malignant neoplasm of breast: Secondary | ICD-10-CM

## 2011-08-27 ENCOUNTER — Other Ambulatory Visit: Payer: Self-pay

## 2011-08-27 NOTE — Telephone Encounter (Signed)
Called pt and left message for her to call office back.  Rec'd fax from [facility] pharmacy stating pt would like a hard copy for 90 day supply of letrozole 2.5 mg daily.  Left pt message to see if she will pick up or if she wants this office to send to mail-order pharmacy.

## 2012-01-14 ENCOUNTER — Telehealth: Payer: Self-pay | Admitting: *Deleted

## 2012-01-14 NOTE — Telephone Encounter (Signed)
PT REPORTS THAT THE LETROZOLE IS CAUSING JOINT PAIN AND SHE IS UNABLE TO TOLERATE TAKING IT ANY LONGER. PT HAS BEEN INSTRUCTED TO STOP THE LETROZOLE FOR 1 MONTH AND WE WILL SCHEDULE HER A F/U VISIT WITH MD THEN. ONC TX HAS BEEN SENT TO SCHEDULING

## 2012-01-15 ENCOUNTER — Telehealth: Payer: Self-pay | Admitting: Oncology

## 2012-01-15 NOTE — Telephone Encounter (Signed)
S/w the pt and she is aware of her July 2013 appts °

## 2012-02-17 ENCOUNTER — Other Ambulatory Visit (HOSPITAL_BASED_OUTPATIENT_CLINIC_OR_DEPARTMENT_OTHER): Payer: BC Managed Care – PPO | Admitting: Lab

## 2012-02-17 ENCOUNTER — Ambulatory Visit (HOSPITAL_BASED_OUTPATIENT_CLINIC_OR_DEPARTMENT_OTHER): Payer: BC Managed Care – PPO | Admitting: Oncology

## 2012-02-17 ENCOUNTER — Telehealth: Payer: Self-pay | Admitting: Oncology

## 2012-02-17 VITALS — BP 114/66 | HR 72 | Temp 98.7°F | Ht 65.5 in | Wt 164.4 lb

## 2012-02-17 DIAGNOSIS — C50919 Malignant neoplasm of unspecified site of unspecified female breast: Secondary | ICD-10-CM | POA: Insufficient documentation

## 2012-02-17 DIAGNOSIS — Z170 Estrogen receptor positive status [ER+]: Secondary | ICD-10-CM

## 2012-02-17 DIAGNOSIS — C50219 Malignant neoplasm of upper-inner quadrant of unspecified female breast: Secondary | ICD-10-CM

## 2012-02-17 LAB — CBC WITH DIFFERENTIAL/PLATELET
BASO%: 0.3 % (ref 0.0–2.0)
Basophils Absolute: 0 10*3/uL (ref 0.0–0.1)
EOS%: 3.5 % (ref 0.0–7.0)
Eosinophils Absolute: 0.2 10*3/uL (ref 0.0–0.5)
HCT: 39.3 % (ref 34.8–46.6)
HGB: 13.5 g/dL (ref 11.6–15.9)
LYMPH%: 36.1 % (ref 14.0–49.7)
MCH: 32.8 pg (ref 25.1–34.0)
MCHC: 34.2 g/dL (ref 31.5–36.0)
MCV: 95.9 fL (ref 79.5–101.0)
MONO#: 0.6 10*3/uL (ref 0.1–0.9)
MONO%: 10.7 % (ref 0.0–14.0)
NEUT#: 2.8 10*3/uL (ref 1.5–6.5)
NEUT%: 49.4 % (ref 38.4–76.8)
Platelets: 184 10*3/uL (ref 145–400)
RBC: 4.1 10*6/uL (ref 3.70–5.45)
RDW: 12.6 % (ref 11.2–14.5)
WBC: 5.6 10*3/uL (ref 3.9–10.3)
lymph#: 2 10*3/uL (ref 0.9–3.3)

## 2012-02-17 MED ORDER — TAMOXIFEN CITRATE 20 MG PO TABS: 20.0000 mg | ORAL_TABLET | Freq: Every day | ORAL | Status: AC

## 2012-02-17 NOTE — Progress Notes (Signed)
ID: Jacqueline Schroeder   DOB: [birth year]  MR#: [id number]  WUJ#:[id number]  HISTORY OF PRESENT ILLNESS: The patient herself palpated a mass in her left breast.  She was already set up for mammography and she was having bilateral cataract surgery around the same time, so she was not in a particular hurry, but she did bring it to Dr. Irish Lack attention and on June 06, 2007, she had bilateral screening mammography at [facility].  Compared to the year previous, the patient of course continued to have dense breasts, but there was a new left breast nodule which was further evaluated on November 25th with compression views and ultrasound.  There were no suspicious calcifications but the distortion was persistent and the ultrasound showed a hypoechoic mass measuring up to 15.4 mm.  On November 28th, the patient underwent ultrasound-guided biopsy at the [facility] and this showed ([id number] and [phone]) an invasive ductal carcinoma which was 100% ER positive, 99% PR positive, with a low proliferation marker at 12%, HER-2/neu negative at 1+.    On July 05, 2007, she underwent bilateral breast MRIs.  This confirmed the presence of a 2 cm irregular enhancing mass in the upper inner quadrant of the left breast.  There was a second macrolobular mass in the upper outer quadrant of the breast, measuring 2.4 cm, which is a known previously biopsied fibroadenoma.  In addition, in the upper aspect of the right breast, was an enhancing nodule measuring 7 mm.  This was evaluated with ultrasound on December 11th and it showed no sonographically suspicious findings.  The MRI was reviewed by Dr. Manson Passey.  This was felt to be most likely a benign process; 59-month follow up was suggested.  With this information, the patient proceeded to left lumpectomy and sentinel lymph node biopsy December 31st, 2008.  This showed ([id number]) a 1.8 cm invasive ductal carcinoma, grade 2, with negative margins and 0 of 2 sentinel  lymph nodes involved. Her subsequent history is as detailed below.  INTERVAL HISTORY: Jacqueline Schroeder returns today for routine followup of her breast cancer. Interval history is unremarkable. She continues to do quite a bit of traveling, including to [place], and this keeps her from working out as much as she would like, although she still manages to get to the gym or otherwise do some activities 3-4 days a week.  REVIEW OF SYSTEMS: She is not having any problems with hot flashes, arthralgias/myalgias, or vaginal dryness. She knows she would feel better if she stopped the letrozole, even though generally she is tolerating it well. The whole idea of continuing a pill is burdensome. On the other hand she only laxity year and right now she is willing to continue. A detailed review of systems today was otherwise entirely negative.  PAST MEDICAL HISTORY: Significant for simple hysterectomy and bladder suspension last year (no salpingo-oophorectomy).  Status post bilateral cataract surgery.  Status post removal of seborrheic keratoses from the chest wall at the same time that she had her left lumpectomy.  She has had prior breast biopsies, which have been benign.  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  She has a history of C. difficile colitis following her hysterectomy, treated with what sounds like vancomycin orally for 7 days.  FAMILY HISTORY The patient's father was diagnosed with prostate cancer at age 63.  The patient's mother  has a history of ductal carcinoma in situ, diagnosed at age 64 or so.  She received only radiation.  The patient has 3 brothers.  There is no history of ovarian cancer in the family.  The only other breast cancer history is remote great-grandparents.    GYNECOLOGIC HISTORY: She is GX P2.  First pregnancy age 77.  S/p hysterectomy and BSO  SOCIAL HISTORY: She works in Consulting civil engineer for [employer].  Her  son, Gregary Signs, 24,  attended college and he joined the [employer] in Armed forces logistics/support/administrative officer.  Daughter [name] has a job here in town.  There are no grandchildren.  The patient attends [employer].   ADVANCED DIRECTIVES:  HEALTH MAINTENANCE: History  Substance Use Topics  . Smoking status: Not on file  . Smokeless tobacco: Not on file  . Alcohol Use: Not on file     Colonoscopy:  PAP:  Bone density:  Lipid panel:  Allergies  Allergen Reactions  . Iohexol      Code: HIVES, Desc: pt states hives   50yrs ago; given 50mg  benadryl po prior to scan; documented 08/23/07 slg     Current Outpatient Prescriptions  Medication Sig Dispense Refill  . letrozole (FEMARA) 2.5 MG tablet       . SYNTHROID 50 MCG tablet       . meloxicam (MOBIC) 15 MG tablet         OBJECTIVE: Middle-aged white woman who appears healthy Filed Vitals:   02/17/12 1046  BP: 114/66  Pulse: 72  Temp: 98.7 F (37.1 C)     Body mass index is 26.94 kg/(m^2).    ECOG FS: 0  Sclerae unicteric Oropharynx clear No cervical or supraclavicular adenopathy Lungs no rales or rhonchi Heart regular rate and rhythm Abd benign MSK no focal spinal tenderness, no peripheral edema Neuro: nonfocal Breasts: The right breast is unremarkable. The left breast is status post lumpectomy and radiation. There is no evidence of local recurrence.  LAB RESULTS: Lab Results  Component Value Date   WBC 5.6 02/17/2012   NEUTROABS 2.8 02/17/2012   HGB 13.5 02/17/2012   HCT 39.3 02/17/2012   MCV 95.9 02/17/2012   PLT 184 02/17/2012      Chemistry      Component Value Date/Time   NA 139 04/27/2011 1243   NA 139 04/27/2011 1243   NA 139 04/27/2011 1243   K 3.9 04/27/2011 1243   K 3.9 04/27/2011 1243   K 3.9 04/27/2011 1243   CL 102 04/27/2011 1243   CL 102 04/27/2011 1243   CL 102 04/27/2011 1243   CO2 27 04/27/2011 1243   CO2 27 04/27/2011 1243   CO2 27 04/27/2011 1243   BUN 15 04/27/2011 1243   BUN 15 04/27/2011 1243   BUN 15 04/27/2011 1243    CREATININE 0.87 04/27/2011 1243   CREATININE 0.87 04/27/2011 1243   CREATININE 0.87 04/27/2011 1243      Component Value Date/Time   CALCIUM 9.5 04/27/2011 1243   CALCIUM 9.5 04/27/2011 1243   CALCIUM 9.5 04/27/2011 1243   ALKPHOS 39 04/27/2011 1243   ALKPHOS 39 04/27/2011 1243   ALKPHOS 39 04/27/2011 1243   AST 21 04/27/2011 1243   AST 21 04/27/2011 1243   AST 21 04/27/2011 1243   ALT 37* 04/27/2011 1243   ALT 37* 04/27/2011 1243   ALT 37* 04/27/2011 1243   BILITOT 0.4 04/27/2011 1243   BILITOT 0.4 04/27/2011 1243   BILITOT 0.4 04/27/2011 1243       Lab Results  Component Value Date   LABCA2 22 04/27/2011  LABCA2 22 04/27/2011    No components found with this basename: [id number]    No results found for this basename: INR:1;PROTIME:1 in the last 168 hours  Urinalysis    Component Value Date/Time   COLORURINE YELLOW 07/25/2007 1029   APPEARANCEUR CLEAR 07/25/2007 1029   LABSPEC 1.011 07/25/2007 1029   PHURINE 6.5 07/25/2007 1029   GLUCOSEU NEGATIVE 07/25/2007 1029   HGBUR NEGATIVE 07/25/2007 1029   BILIRUBINUR NEGATIVE 07/25/2007 1029   KETONESUR NEGATIVE 07/25/2007 1029   PROTEINUR NEGATIVE 07/25/2007 1029   UROBILINOGEN 0.2 07/25/2007 1029   NITRITE NEGATIVE 07/25/2007 1029   LEUKOCYTESUR NEGATIVE MICROSCOPIC NOT DONE ON URINES WITH NEGATIVE PROTEIN, BLOOD, LEUKOCYTES, NITRITE, OR GLUCOSE <1000 mg/dL. 07/25/2007 1029    STUDIES: Mammography at the breast center 07/30/2011 was unremarkable. Bone density 04/30/2011 showed stable osteopenia  ASSESSMENT: 56 y.o. [place] woman status post left lumpectomy and sentinel lymph node biopsy December of 2008 for a T1c N0, grade 2 invasive ductal carcinoma with a low proliferation fraction, strongly estrogen and progesterone receptor positive, HER-2 negative, treated adjuvantly with docetaxel and cyclophosphamide x4, completed April of 2009.  She started tamoxifen at the completion of radiation in July of 2009.  She had an Oncotype DX  recurrence score of 21 predicting a risk of recurrence of 13% if all she did was take tamoxifen. She  switched to letrozole August 2012  PLAN: We discussed her options at length, including continuing the letrozole, switching back to tamoxifen, or discontinuing antiestrogen therapy altogether. After much discussion what she wanted to do was to try the tamoxifen again, and I went ahead and wrote her the prescription. She will see me again, one year from now, and will plan to "graduate" at that visit. She knows to call for any problems that may develop before that.   Jacqueline Schroeder C    02/17/2012

## 2012-02-17 NOTE — Telephone Encounter (Signed)
S/w michelle from dr baxley's office and she stated that they will get the pt scheduled for an appt and call the pt directly.

## 2012-02-18 ENCOUNTER — Telehealth: Payer: Self-pay | Admitting: Oncology

## 2012-02-18 LAB — COMPREHENSIVE METABOLIC PANEL
Albumin: 4.3 g/dL (ref 3.5–5.2)
BUN: 17 mg/dL (ref 6–23)
Calcium: 9.7 mg/dL (ref 8.4–10.5)
Chloride: 106 mEq/L (ref 96–112)
Glucose, Bld: 87 mg/dL (ref 70–99)
Potassium: 4.4 mEq/L (ref 3.5–5.3)
Sodium: 140 mEq/L (ref 135–145)
Total Protein: 6.6 g/dL (ref 6.0–8.3)

## 2012-02-18 NOTE — Telephone Encounter (Signed)
S/w donnie from dr whitfield's office and the pt is scheduled to see him on 02/26/2012@11 :00am

## 2012-04-22 ENCOUNTER — Telehealth: Payer: Self-pay | Admitting: Oncology

## 2012-04-22 NOTE — Telephone Encounter (Signed)
lmonvm adviisng the pt of her July 2014 appts

## 2012-08-04 ENCOUNTER — Other Ambulatory Visit: Payer: Self-pay | Admitting: Oncology

## 2012-08-04 DIAGNOSIS — Z853 Personal history of malignant neoplasm of breast: Secondary | ICD-10-CM

## 2012-08-23 ENCOUNTER — Ambulatory Visit
Admission: RE | Admit: 2012-08-23 | Discharge: 2012-08-23 | Disposition: A | Discharge status: Home / Self Care | Payer: BC Managed Care – PPO | Source: Ambulatory Visit | Attending: Oncology | Admitting: Oncology

## 2012-08-23 DIAGNOSIS — Z853 Personal history of malignant neoplasm of breast: Secondary | ICD-10-CM

## 2013-02-17 ENCOUNTER — Other Ambulatory Visit: Payer: Self-pay | Admitting: Physician Assistant

## 2013-02-17 DIAGNOSIS — C50919 Malignant neoplasm of unspecified site of unspecified female breast: Secondary | ICD-10-CM

## 2013-02-20 ENCOUNTER — Telehealth: Payer: Self-pay | Admitting: Oncology

## 2013-02-20 ENCOUNTER — Other Ambulatory Visit (HOSPITAL_BASED_OUTPATIENT_CLINIC_OR_DEPARTMENT_OTHER): Payer: BC Managed Care – PPO | Admitting: Lab

## 2013-02-20 ENCOUNTER — Ambulatory Visit (HOSPITAL_BASED_OUTPATIENT_CLINIC_OR_DEPARTMENT_OTHER): Payer: BC Managed Care – PPO | Admitting: Oncology

## 2013-02-20 VITALS — BP 105/69 | HR 71 | Temp 98.6°F | Resp 20 | Ht 65.5 in | Wt 163.0 lb

## 2013-02-20 DIAGNOSIS — C50919 Malignant neoplasm of unspecified site of unspecified female breast: Secondary | ICD-10-CM

## 2013-02-20 LAB — COMPREHENSIVE METABOLIC PANEL (CC13)
ALT: 17 U/L (ref 0–55)
AST: 19 U/L (ref 5–34)
Albumin: 3.9 g/dL (ref 3.5–5.0)
Alkaline Phosphatase: 41 U/L (ref 40–150)
Glucose: 97 mg/dl (ref 70–140)
Potassium: 4 mEq/L (ref 3.5–5.1)
Sodium: 141 mEq/L (ref 136–145)
Total Bilirubin: 0.36 mg/dL (ref 0.20–1.20)
Total Protein: 6.9 g/dL (ref 6.4–8.3)

## 2013-02-20 LAB — CBC WITH DIFFERENTIAL/PLATELET
BASO%: 0.3 % (ref 0.0–2.0)
Eosinophils Absolute: 0.2 10*3/uL (ref 0.0–0.5)
LYMPH%: 30.4 % (ref 14.0–49.7)
MCHC: 34.6 g/dL (ref 31.5–36.0)
MCV: 94.6 fL (ref 79.5–101.0)
MONO#: 0.6 10*3/uL (ref 0.1–0.9)
MONO%: 7.5 % (ref 0.0–14.0)
NEUT#: 4.7 10*3/uL (ref 1.5–6.5)
RBC: 4.09 10*6/uL (ref 3.70–5.45)
RDW: 12.5 % (ref 11.2–14.5)
WBC: 7.9 10*3/uL (ref 3.9–10.3)

## 2013-02-20 NOTE — Progress Notes (Signed)
ID: Jacqueline Schroeder   DOB: 1956-02-05  MR#: [id number]  FAO#:[id number]  PCP: Jacqueline Cho, MD GYN: SU:  OTHER MD:   HISTORY OF PRESENT ILLNESS: The patient herself palpated a mass in her left breast.  She was already set up for mammography and she was having bilateral cataract surgery around the same time, so she was not in a particular hurry, but she did bring it to Dr. Irish Schroeder attention and on June 06, 2007, she had bilateral screening mammography at [facility].  Compared to the year previous, the patient of course continued to have dense breasts, but there was a new left breast nodule which was further evaluated on November 25th with compression views and ultrasound.  There were no suspicious calcifications but the distortion was persistent and the ultrasound showed a hypoechoic mass measuring up to 15.4 mm.  On November 28th, the patient underwent ultrasound-guided biopsy at the [facility] and this showed ([id number] [phone]) an invasive ductal carcinoma which was 100% ER positive, 99% PR positive, with a low proliferation marker at 12%, HER-2/neu negative at 1+.    On July 05, 2007, she underwent bilateral breast MRIs.  This confirmed the presence of a 2 cm irregular enhancing mass in the upper inner quadrant of the left breast.  There was a second macrolobular mass in the upper outer quadrant of the breast, measuring 2.4 cm, which is a known previously biopsied fibroadenoma.  In addition, in the upper aspect of the right breast, was an enhancing nodule measuring 7 mm.  This was evaluated with ultrasound on December 11th and it showed no sonographically suspicious findings.  The MRI was reviewed by Jacqueline Schroeder.  This was felt to be most likely a benign process; 5-month follow up was suggested.  With this information, the patient proceeded to left lumpectomy and sentinel lymph node biopsy December 31st, 2008.  This showed ([id number]) a 1.8 cm invasive ductal carcinoma,  grade 2, with negative margins and 0 of 2 sentinel lymph nodes involved. Her subsequent history is as detailed below.  INTERVAL HISTORY: Jacqueline Schroeder returns today for routine followup of her breast cancer. 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 Both she and her husband will be retiring later this year. They're planning to travel, and visiting the grandchildren which we'll start coming (her son in [place], who incidentally was diagnosed with thyroid cancer I, will have his first child this year).Marland Kitchen  REVIEW OF SYSTEMS: She tolerated the antiestrogen is without any significant problems. She does have occasional ankle swelling. When she exercises more, for instance spin, she doesn't get any swelling. She wears compression stockings when she flies. She has mild urinary stress incontinence, and chronic low back pain. Otherwise a detailed review of systems was noncontributory.  PAST MEDICAL HISTORY: Significant for simple hysterectomy and bladder suspension last year (no salpingo-oophorectomy).  Status post bilateral cataract surgery.  Status post removal of seborrheic keratoses from the chest wall at the same time that she had her left lumpectomy.  She has had prior breast biopsies, which have been benign.  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  She has a history of C. difficile colitis following her hysterectomy, treated with what sounds like vancomycin orally for 7 days.  FAMILY HISTORY The patient's father was diagnosed with prostate cancer at age 37.  The patient's mother  has a history of ductal carcinoma in  situ, diagnosed at age 30 or so.  She received only radiation.  The patient has 3 brothers.  There is no history of ovarian cancer in the family.  The only other breast cancer history is remote great-grandparents.    GYNECOLOGIC HISTORY: She is GX P2.   First pregnancy age 89.  S/p hysterectomy and BSO  SOCIAL HISTORY: She works in Consulting civil engineer for [employer].  Her  son, Jacqueline Schroeder,,ttended college and he joined the [employer] in Armed forces logistics/support/administrative officer.  Daughter Jacqueline Schroeder has a job here in town.  There are no grandchildren yet.  The patient attends [employer]. [employer].   ADVANCED DIRECTIVES: In place  HEALTH MAINTENANCE: History  Substance Use Topics  . Smoking status: Not on file  . Smokeless tobacco: Not on file  . Alcohol Use: Not on file     Colonoscopy:  PAP:  Bone density:  Lipid panel:  Allergies  Allergen Reactions  . Iohexol      Code: HIVES, Desc: pt states hives   37yrs ago; given 50mg  benadryl po prior to scan; documented 08/23/07 slg     Current Outpatient Prescriptions  Medication Sig Dispense Refill  . meloxicam (MOBIC) 15 MG tablet       . SYNTHROID 50 MCG tablet        No current facility-administered medications for this visit.    OBJECTIVE: Middle-aged white woman in no acute distress Filed Vitals:   02/20/13 1138  BP: 105/69  Pulse: 71  Temp: 98.6 F (37 C)  Resp: 20     Body mass index is 26.7 kg/(m^2).    ECOG FS: 0  Sclerae unicteric, pupils equal round and reactive to light Oropharynx clear No cervical or supraclavicular adenopathy Lungs no rales or rhonchi Heart regular rate and rhythm Abd benign MSK no focal spinal tenderness, no peripheral edema Neuro: nonfocal, well oriented, appropriate affect Breasts: The right breast is unremarkable. The left breast is status post lumpectomy and radiation. There is no evidence of local recurrence. The left axilla is benign  LAB RESULTS: Lab Results  Component Value Date   WBC 7.9 02/20/2013   NEUTROABS 4.7 02/20/2013   HGB 13.4 02/20/2013   HCT 38.7 02/20/2013   MCV 94.6 02/20/2013   PLT 173 02/20/2013      Chemistry      Component Value Date/Time   NA 141 02/20/2013 1054   NA 140 02/17/2012 1035   K 4.0 02/20/2013 1054   K 4.4 02/17/2012 1035   CL 106 02/17/2012  1035   CO2 22 02/20/2013 1054   CO2 27 02/17/2012 1035   BUN 14.4 02/20/2013 1054   BUN 17 02/17/2012 1035   CREATININE 0.8 02/20/2013 1054   CREATININE 0.86 02/17/2012 1035      Component Value Date/Time   CALCIUM 8.8 02/20/2013 1054   CALCIUM 9.7 02/17/2012 1035   ALKPHOS 41 02/20/2013 1054   ALKPHOS 55 02/17/2012 1035   AST 19 02/20/2013 1054   AST 19 02/17/2012 1035   ALT 17 02/20/2013 1054   ALT 24 02/17/2012 1035   BILITOT 0.36 02/20/2013 1054   BILITOT 0.4 02/17/2012 1035       Lab Results  Component Value Date   LABCA2 23 02/17/2012    No components found with this basename: [id number]    No results found for this basename: INR,  in the last 168 hours  Urinalysis    Component Value Date/Time   COLORURINE YELLOW 07/25/2007 1029   APPEARANCEUR CLEAR 07/25/2007 1029  LABSPEC 1.011 07/25/2007 1029   PHURINE 6.5 07/25/2007 1029   GLUCOSEU NEGATIVE 07/25/2007 1029   HGBUR NEGATIVE 07/25/2007 1029   BILIRUBINUR NEGATIVE 07/25/2007 1029   KETONESUR NEGATIVE 07/25/2007 1029   PROTEINUR NEGATIVE 07/25/2007 1029   UROBILINOGEN 0.2 07/25/2007 1029   NITRITE NEGATIVE 07/25/2007 1029   LEUKOCYTESUR NEGATIVE MICROSCOPIC NOT DONE ON URINES WITH NEGATIVE PROTEIN, BLOOD, LEUKOCYTES, NITRITE, OR GLUCOSE <1000 mg/dL. 07/25/2007 1029    STUDIES: Mammography at the breast center 08/15/2012 was unremarkable. Bone density 04/30/2011 showed stable osteopenia  ASSESSMENT: 57 y.o. [place] woman status post left lumpectomy and sentinel lymph node biopsy December of 2008 for a T1c N0, grade 2 invasive ductal carcinoma with a low proliferation fraction, strongly estrogen and progesterone receptor positive, HER-2 negative, treated adjuvantly with docetaxel and cyclophosphamide x4, completed April of 2009.  She started tamoxifen at the completion of radiation in July of 2009.  She had an Oncotype DX recurrence score of 21 predicting a risk of recurrence of 13% if all she did was take tamoxifen. She   switched to letrozole August 2012  PLAN: Camber has completed 5 years of antiestrogen therapy. She has a very good chance of this tumor not recurring and she has also cut in half the risk of another breast cancer developing.  At this point uncomfortable releasing her from followup here. She understands she will need yearly mammography and yearly physician breast exam. Of course we will be glad to see her at any point in the future as needed, but as of now no further appointments are being made for her here.   MAGRINAT,GUSTAV C    02/20/2013

## 2013-02-20 NOTE — Telephone Encounter (Signed)
Per 7/28 pof see Jacqueline Schroeder for primary care. No orders given for f/u here - per pt none needed. Pt given information for Dr. Clelia Croft and she will contact office to set up primary care.

## 2013-07-31 ENCOUNTER — Other Ambulatory Visit: Payer: Self-pay | Admitting: Oncology

## 2013-07-31 ENCOUNTER — Other Ambulatory Visit: Payer: Self-pay | Admitting: Internal Medicine

## 2013-07-31 DIAGNOSIS — Z853 Personal history of malignant neoplasm of breast: Secondary | ICD-10-CM

## 2013-07-31 DIAGNOSIS — Z1231 Encounter for screening mammogram for malignant neoplasm of breast: Secondary | ICD-10-CM

## 2013-08-24 ENCOUNTER — Ambulatory Visit
Admission: RE | Admit: 2013-08-24 | Discharge: 2013-08-24 | Disposition: A | Discharge status: Home / Self Care | Payer: BC Managed Care – PPO | Source: Ambulatory Visit | Attending: Internal Medicine | Admitting: Internal Medicine

## 2013-08-24 DIAGNOSIS — Z853 Personal history of malignant neoplasm of breast: Secondary | ICD-10-CM

## 2014-08-03 ENCOUNTER — Other Ambulatory Visit: Payer: Self-pay | Admitting: Gynecology

## 2014-08-03 DIAGNOSIS — Z853 Personal history of malignant neoplasm of breast: Secondary | ICD-10-CM

## 2014-08-27 ENCOUNTER — Ambulatory Visit
Admission: RE | Admit: 2014-08-27 | Discharge: 2014-08-27 | Disposition: A | Discharge status: Home / Self Care | Payer: BLUE CROSS/BLUE SHIELD | Source: Ambulatory Visit | Attending: Gynecology | Admitting: Gynecology

## 2014-08-27 DIAGNOSIS — Z853 Personal history of malignant neoplasm of breast: Secondary | ICD-10-CM

## 2014-10-29 ENCOUNTER — Other Ambulatory Visit: Payer: Self-pay | Admitting: Gynecology

## 2014-10-30 LAB — CYTOLOGY - PAP

## 2015-08-09 ENCOUNTER — Other Ambulatory Visit: Payer: Self-pay

## 2015-08-09 DIAGNOSIS — Z853 Personal history of malignant neoplasm of breast: Secondary | ICD-10-CM

## 2015-08-09 DIAGNOSIS — Z1231 Encounter for screening mammogram for malignant neoplasm of breast: Secondary | ICD-10-CM

## 2015-08-15 ENCOUNTER — Encounter (HOSPITAL_COMMUNITY): Payer: Self-pay

## 2015-08-15 ENCOUNTER — Ambulatory Visit (HOSPITAL_COMMUNITY)
Admission: RE | Admit: 2015-08-15 | Discharge: 2015-08-15 | Disposition: A | Discharge status: Home / Self Care | Payer: BLUE CROSS/BLUE SHIELD | Source: Ambulatory Visit | Attending: Internal Medicine | Admitting: Internal Medicine

## 2015-08-15 DIAGNOSIS — K219 Gastro-esophageal reflux disease without esophagitis: Secondary | ICD-10-CM | POA: Insufficient documentation

## 2015-08-15 DIAGNOSIS — I498 Other specified cardiac arrhythmias: Secondary | ICD-10-CM | POA: Insufficient documentation

## 2015-08-15 DIAGNOSIS — M1990 Unspecified osteoarthritis, unspecified site: Secondary | ICD-10-CM | POA: Diagnosis present

## 2015-08-15 DIAGNOSIS — Z853 Personal history of malignant neoplasm of breast: Secondary | ICD-10-CM | POA: Diagnosis not present

## 2015-08-15 DIAGNOSIS — E039 Hypothyroidism, unspecified: Secondary | ICD-10-CM | POA: Insufficient documentation

## 2015-08-15 HISTORY — DX: Irritable bowel syndrome without diarrhea: K58.9

## 2015-08-15 HISTORY — DX: Personal history of other endocrine, nutritional and metabolic disease: Z86.39

## 2015-08-15 HISTORY — DX: Personal history of other diseases of the digestive system: Z87.19

## 2015-08-15 HISTORY — DX: Malignant (primary) neoplasm, unspecified: C80.1

## 2015-08-15 HISTORY — DX: Cardiac arrhythmia, unspecified: I49.9

## 2015-08-15 HISTORY — DX: Hypothyroidism, unspecified: E03.9

## 2015-08-15 HISTORY — DX: Gastro-esophageal reflux disease without esophagitis: K21.9

## 2015-08-15 HISTORY — DX: Personal history of other infectious and parasitic diseases: Z86.19

## 2015-08-15 MED ORDER — SODIUM CHLORIDE 0.9 % IV SOLN
Freq: Once | INTRAVENOUS | Status: AC
  Administered 2015-08-15: 10:00:00 via INTRAVENOUS

## 2015-08-15 MED ORDER — ZOLEDRONIC ACID 5 MG/100ML IV SOLN
5.0000 mg | Freq: Once | INTRAVENOUS | Status: AC
  Administered 2015-08-15: 5 mg via INTRAVENOUS
  Filled 2015-08-15: qty 100

## 2015-08-15 NOTE — Discharge Instructions (Signed)
Zoledronic Acid injection (Paget's Disease, Osteoporosis) What is this medicine? ZOLEDRONIC ACID (ZOE le dron ik AS id) lowers the amount of calcium loss from bone. It is used to treat Paget's disease and osteoporosis in women. This medicine may be used for other purposes; ask your health care provider or pharmacist if you have questions. What should I tell my health care provider before I take this medicine? They need to know if you have any of these conditions: -aspirin-sensitive asthma -cancer, especially if you are receiving medicines used to treat cancer -dental disease or wear dentures -infection -kidney disease -low levels of calcium in the blood -past surgery on the parathyroid gland or intestines -receiving corticosteroids like dexamethasone or prednisone -an unusual or allergic reaction to zoledronic acid, other medicines, foods, dyes, or preservatives -pregnant or trying to get pregnant -breast-feeding How should I use this medicine? This medicine is for infusion into a vein. It is given by a health care professional in a hospital or clinic setting. Talk to your pediatrician regarding the use of this medicine in children. This medicine is not approved for use in children. Overdosage: If you think you have taken too much of this medicine contact a poison control center or emergency room at once. NOTE: This medicine is only for you. Do not share this medicine with others. What if I miss a dose? It is important not to miss your dose. Call your doctor or health care professional if you are unable to keep an appointment. . What should I watch for while using this medicine? Visit your doctor or health care professional for regular checkups. It may be some time before you see the benefit from this medicine. Do not stop taking your medicine unless your doctor tells you to. Your doctor may order blood tests or other tests to see how you are doing. Women should inform their doctor if they  wish to become pregnant or think they might be pregnant. There is a potential for serious side effects to an unborn child. Talk to your health care professional or pharmacist for more information. You should make sure that you get enough calcium and vitamin D while you are taking this medicine. Discuss the foods you eat and the vitamins you take with your health care professional. Some people who take this medicine have severe bone, joint, and/or muscle pain. This medicine may also increase your risk for jaw problems or a broken thigh bone. Tell your doctor right away if you have severe pain in your jaw, bones, joints, or muscles. Tell your doctor if you have any pain that does not go away or that gets worse. Tell your dentist and dental surgeon that you are taking this medicine. You should not have major dental surgery while on this medicine. See your dentist to have a dental exam and fix any dental problems before starting this medicine. Take good care of your teeth while on this medicine. Make sure you see your dentist for regular follow-up appointments. What side effects may I notice from receiving this medicine? Side effects that you should report to your doctor or health care professional as soon as possible: -allergic reactions like skin rash, itching or hives, swelling of the face, lips, or tongue -anxiety, confusion, or depression -breathing problems -changes in vision -eye pain -feeling faint or lightheaded, falls -jaw pain, especially after dental work -mouth sores -muscle cramps, stiffness, or weakness -redness, blistering, peeling or loosening of the skin, including inside the mouth -trouble passing urine or  change in the amount of urine Side effects that usually do not require medical attention (report to your doctor or health care professional if they continue or are bothersome): -bone, joint, or muscle pain -constipation -diarrhea -fever -hair loss -irritation at site where  injected -loss of appetite -nausea, vomiting -stomach upset -trouble sleeping -trouble swallowing -weak or tired This list may not describe all possible side effects. Call your doctor for medical advice about side effects. You may report side effects to FDA at 1-800-FDA-1088. Where should I keep my medicine? This drug is given in a hospital or clinic and will not be stored at home. NOTE: This sheet is a summary. It may not cover all possible information. If you have questions about this medicine, talk to your doctor, pharmacist, or health care provider.    2016, Elsevier/Gold Standard. (2013-12-09 14:19:57)    Continue calcium and vitamin D daily

## 2015-08-15 NOTE — Progress Notes (Signed)
Understands to continue Calcium  And Vit D. Tolerated reclast treatment well w/o immediate rxn.

## 2015-09-02 ENCOUNTER — Ambulatory Visit
Admission: RE | Admit: 2015-09-02 | Discharge: 2015-09-02 | Disposition: A | Discharge status: Home / Self Care | Payer: BLUE CROSS/BLUE SHIELD | Source: Ambulatory Visit

## 2015-09-02 DIAGNOSIS — Z1231 Encounter for screening mammogram for malignant neoplasm of breast: Secondary | ICD-10-CM

## 2015-09-02 DIAGNOSIS — Z853 Personal history of malignant neoplasm of breast: Secondary | ICD-10-CM

## 2016-08-28 ENCOUNTER — Other Ambulatory Visit: Payer: Self-pay | Admitting: Internal Medicine

## 2016-08-28 DIAGNOSIS — Z1231 Encounter for screening mammogram for malignant neoplasm of breast: Secondary | ICD-10-CM

## 2016-09-04 ENCOUNTER — Ambulatory Visit
Admission: RE | Admit: 2016-09-04 | Discharge: 2016-09-04 | Disposition: A | Discharge status: Home / Self Care | Payer: 59 | Source: Ambulatory Visit | Attending: Internal Medicine | Admitting: Internal Medicine

## 2016-09-04 DIAGNOSIS — Z1231 Encounter for screening mammogram for malignant neoplasm of breast: Secondary | ICD-10-CM | POA: Diagnosis not present

## 2016-09-04 HISTORY — DX: Personal history of antineoplastic chemotherapy: Z92.21

## 2016-09-04 HISTORY — DX: Personal history of irradiation: Z92.3

## 2016-09-08 ENCOUNTER — Other Ambulatory Visit: Payer: Self-pay | Admitting: Internal Medicine

## 2016-09-08 DIAGNOSIS — R928 Other abnormal and inconclusive findings on diagnostic imaging of breast: Secondary | ICD-10-CM

## 2016-09-14 ENCOUNTER — Ambulatory Visit
Admission: RE | Admit: 2016-09-14 | Discharge: 2016-09-14 | Disposition: A | Discharge status: Home / Self Care | Payer: 59 | Source: Ambulatory Visit | Attending: Internal Medicine | Admitting: Internal Medicine

## 2016-09-14 DIAGNOSIS — R928 Other abnormal and inconclusive findings on diagnostic imaging of breast: Secondary | ICD-10-CM

## 2016-09-14 DIAGNOSIS — R922 Inconclusive mammogram: Secondary | ICD-10-CM | POA: Diagnosis not present

## 2016-10-02 DIAGNOSIS — Z0000 Encounter for general adult medical examination without abnormal findings: Secondary | ICD-10-CM | POA: Diagnosis not present

## 2016-10-06 DIAGNOSIS — Z0000 Encounter for general adult medical examination without abnormal findings: Secondary | ICD-10-CM | POA: Diagnosis not present

## 2016-10-09 DIAGNOSIS — Z0000 Encounter for general adult medical examination without abnormal findings: Secondary | ICD-10-CM | POA: Diagnosis not present

## 2016-10-09 DIAGNOSIS — E784 Other hyperlipidemia: Secondary | ICD-10-CM | POA: Diagnosis not present

## 2016-10-09 DIAGNOSIS — C50919 Malignant neoplasm of unspecified site of unspecified female breast: Secondary | ICD-10-CM | POA: Diagnosis not present

## 2016-10-09 DIAGNOSIS — Z1389 Encounter for screening for other disorder: Secondary | ICD-10-CM | POA: Diagnosis not present

## 2016-10-09 DIAGNOSIS — M810 Age-related osteoporosis without current pathological fracture: Secondary | ICD-10-CM | POA: Diagnosis not present

## 2016-11-02 DIAGNOSIS — Z23 Encounter for immunization: Secondary | ICD-10-CM | POA: Diagnosis not present

## 2016-12-07 DIAGNOSIS — E038 Other specified hypothyroidism: Secondary | ICD-10-CM | POA: Diagnosis not present

## 2017-01-03 DIAGNOSIS — Z23 Encounter for immunization: Secondary | ICD-10-CM | POA: Diagnosis not present

## 2017-01-04 DIAGNOSIS — Z961 Presence of intraocular lens: Secondary | ICD-10-CM | POA: Diagnosis not present

## 2017-05-03 DIAGNOSIS — E7849 Other hyperlipidemia: Secondary | ICD-10-CM | POA: Diagnosis not present

## 2017-06-26 ENCOUNTER — Other Ambulatory Visit (INDEPENDENT_AMBULATORY_CARE_PROVIDER_SITE_OTHER): Payer: Self-pay | Admitting: Orthopaedic Surgery

## 2017-08-09 ENCOUNTER — Other Ambulatory Visit: Payer: Self-pay | Admitting: Internal Medicine

## 2017-08-09 DIAGNOSIS — Z139 Encounter for screening, unspecified: Secondary | ICD-10-CM

## 2017-09-06 ENCOUNTER — Ambulatory Visit
Admission: RE | Admit: 2017-09-06 | Discharge: 2017-09-06 | Disposition: A | Discharge status: Home / Self Care | Payer: 59 | Source: Ambulatory Visit | Attending: Internal Medicine | Admitting: Internal Medicine

## 2017-09-06 DIAGNOSIS — Z1231 Encounter for screening mammogram for malignant neoplasm of breast: Secondary | ICD-10-CM | POA: Diagnosis not present

## 2017-09-06 DIAGNOSIS — Z139 Encounter for screening, unspecified: Secondary | ICD-10-CM

## 2017-10-04 DIAGNOSIS — Z0000 Encounter for general adult medical examination without abnormal findings: Secondary | ICD-10-CM | POA: Diagnosis not present

## 2017-10-04 DIAGNOSIS — M810 Age-related osteoporosis without current pathological fracture: Secondary | ICD-10-CM | POA: Diagnosis not present

## 2017-10-04 DIAGNOSIS — R82998 Other abnormal findings in urine: Secondary | ICD-10-CM | POA: Diagnosis not present

## 2017-10-18 DIAGNOSIS — Z1389 Encounter for screening for other disorder: Secondary | ICD-10-CM | POA: Diagnosis not present

## 2017-10-18 DIAGNOSIS — B070 Plantar wart: Secondary | ICD-10-CM | POA: Diagnosis not present

## 2017-10-18 DIAGNOSIS — M810 Age-related osteoporosis without current pathological fracture: Secondary | ICD-10-CM | POA: Diagnosis not present

## 2017-10-18 DIAGNOSIS — E785 Hyperlipidemia, unspecified: Secondary | ICD-10-CM | POA: Diagnosis not present

## 2017-10-18 DIAGNOSIS — Z0000 Encounter for general adult medical examination without abnormal findings: Secondary | ICD-10-CM | POA: Diagnosis not present

## 2017-11-15 DIAGNOSIS — K317 Polyp of stomach and duodenum: Secondary | ICD-10-CM | POA: Diagnosis not present

## 2017-11-15 DIAGNOSIS — D123 Benign neoplasm of transverse colon: Secondary | ICD-10-CM | POA: Diagnosis not present

## 2017-11-15 DIAGNOSIS — D131 Benign neoplasm of stomach: Secondary | ICD-10-CM | POA: Diagnosis not present

## 2017-11-15 DIAGNOSIS — K449 Diaphragmatic hernia without obstruction or gangrene: Secondary | ICD-10-CM | POA: Diagnosis not present

## 2017-11-15 DIAGNOSIS — K2960 Other gastritis without bleeding: Secondary | ICD-10-CM | POA: Diagnosis not present

## 2017-11-15 DIAGNOSIS — K219 Gastro-esophageal reflux disease without esophagitis: Secondary | ICD-10-CM | POA: Diagnosis not present

## 2017-11-15 DIAGNOSIS — K635 Polyp of colon: Secondary | ICD-10-CM | POA: Diagnosis not present

## 2017-11-15 DIAGNOSIS — Z1211 Encounter for screening for malignant neoplasm of colon: Secondary | ICD-10-CM | POA: Diagnosis not present

## 2017-11-15 DIAGNOSIS — D122 Benign neoplasm of ascending colon: Secondary | ICD-10-CM | POA: Diagnosis not present

## 2017-11-15 DIAGNOSIS — K2210 Ulcer of esophagus without bleeding: Secondary | ICD-10-CM | POA: Diagnosis not present

## 2017-11-15 DIAGNOSIS — K222 Esophageal obstruction: Secondary | ICD-10-CM | POA: Diagnosis not present

## 2017-11-15 DIAGNOSIS — R1310 Dysphagia, unspecified: Secondary | ICD-10-CM | POA: Diagnosis not present

## 2017-11-23 ENCOUNTER — Encounter: Payer: Self-pay | Admitting: Internal Medicine

## 2017-12-03 DIAGNOSIS — E038 Other specified hypothyroidism: Secondary | ICD-10-CM | POA: Diagnosis not present

## 2017-12-17 DIAGNOSIS — K210 Gastro-esophageal reflux disease with esophagitis: Secondary | ICD-10-CM | POA: Diagnosis not present

## 2017-12-17 DIAGNOSIS — Z86010 Personal history of colonic polyps: Secondary | ICD-10-CM | POA: Diagnosis not present

## 2018-04-15 DIAGNOSIS — K208 Other esophagitis: Secondary | ICD-10-CM | POA: Diagnosis not present

## 2018-04-15 DIAGNOSIS — K210 Gastro-esophageal reflux disease with esophagitis: Secondary | ICD-10-CM | POA: Diagnosis not present

## 2018-04-15 DIAGNOSIS — K449 Diaphragmatic hernia without obstruction or gangrene: Secondary | ICD-10-CM | POA: Diagnosis not present

## 2018-05-27 DIAGNOSIS — Z23 Encounter for immunization: Secondary | ICD-10-CM | POA: Diagnosis not present

## 2018-08-26 ENCOUNTER — Other Ambulatory Visit: Payer: Self-pay | Admitting: Internal Medicine

## 2018-08-26 DIAGNOSIS — Z1231 Encounter for screening mammogram for malignant neoplasm of breast: Secondary | ICD-10-CM

## 2018-09-19 ENCOUNTER — Ambulatory Visit
Admission: RE | Admit: 2018-09-19 | Discharge: 2018-09-19 | Disposition: A | Discharge status: Home / Self Care | Payer: 59 | Source: Ambulatory Visit | Attending: Internal Medicine | Admitting: Internal Medicine

## 2018-09-19 DIAGNOSIS — Z1231 Encounter for screening mammogram for malignant neoplasm of breast: Secondary | ICD-10-CM

## 2018-10-21 DIAGNOSIS — Z0000 Encounter for general adult medical examination without abnormal findings: Secondary | ICD-10-CM | POA: Diagnosis not present

## 2018-10-24 DIAGNOSIS — E785 Hyperlipidemia, unspecified: Secondary | ICD-10-CM | POA: Diagnosis not present

## 2018-10-24 DIAGNOSIS — K469 Unspecified abdominal hernia without obstruction or gangrene: Secondary | ICD-10-CM | POA: Diagnosis not present

## 2018-10-24 DIAGNOSIS — Z0000 Encounter for general adult medical examination without abnormal findings: Secondary | ICD-10-CM | POA: Diagnosis not present

## 2018-10-24 DIAGNOSIS — Z86010 Personal history of colonic polyps: Secondary | ICD-10-CM | POA: Diagnosis not present

## 2019-08-16 ENCOUNTER — Other Ambulatory Visit: Payer: Self-pay | Admitting: Internal Medicine

## 2019-08-16 DIAGNOSIS — Z1231 Encounter for screening mammogram for malignant neoplasm of breast: Secondary | ICD-10-CM

## 2019-09-22 ENCOUNTER — Other Ambulatory Visit: Payer: Self-pay

## 2019-09-22 ENCOUNTER — Ambulatory Visit
Admission: RE | Admit: 2019-09-22 | Discharge: 2019-09-22 | Disposition: A | Discharge status: Home / Self Care | Payer: 59 | Source: Ambulatory Visit | Attending: Internal Medicine | Admitting: Internal Medicine

## 2019-09-22 DIAGNOSIS — Z1231 Encounter for screening mammogram for malignant neoplasm of breast: Secondary | ICD-10-CM

## 2020-08-21 ENCOUNTER — Other Ambulatory Visit: Payer: Self-pay | Admitting: Internal Medicine

## 2020-08-21 DIAGNOSIS — Z1231 Encounter for screening mammogram for malignant neoplasm of breast: Secondary | ICD-10-CM

## 2020-10-04 ENCOUNTER — Other Ambulatory Visit: Payer: Self-pay

## 2020-10-04 ENCOUNTER — Ambulatory Visit
Admission: RE | Admit: 2020-10-04 | Discharge: 2020-10-04 | Disposition: A | Discharge status: Home / Self Care | Payer: Medicare HMO | Source: Ambulatory Visit | Attending: Internal Medicine | Admitting: Internal Medicine

## 2020-10-04 DIAGNOSIS — Z1231 Encounter for screening mammogram for malignant neoplasm of breast: Secondary | ICD-10-CM | POA: Diagnosis not present

## 2020-10-04 HISTORY — DX: Malignant neoplasm of unspecified site of unspecified female breast: C50.919

## 2020-10-15 DIAGNOSIS — D2261 Melanocytic nevi of right upper limb, including shoulder: Secondary | ICD-10-CM | POA: Diagnosis not present

## 2020-10-15 DIAGNOSIS — D485 Neoplasm of uncertain behavior of skin: Secondary | ICD-10-CM | POA: Diagnosis not present

## 2020-10-15 DIAGNOSIS — L820 Inflamed seborrheic keratosis: Secondary | ICD-10-CM | POA: Diagnosis not present

## 2020-10-15 DIAGNOSIS — D225 Melanocytic nevi of trunk: Secondary | ICD-10-CM | POA: Diagnosis not present

## 2020-10-15 DIAGNOSIS — Z1283 Encounter for screening for malignant neoplasm of skin: Secondary | ICD-10-CM | POA: Diagnosis not present

## 2020-11-12 DIAGNOSIS — E039 Hypothyroidism, unspecified: Secondary | ICD-10-CM | POA: Diagnosis not present

## 2020-11-12 DIAGNOSIS — M810 Age-related osteoporosis without current pathological fracture: Secondary | ICD-10-CM | POA: Diagnosis not present

## 2020-11-12 DIAGNOSIS — E785 Hyperlipidemia, unspecified: Secondary | ICD-10-CM | POA: Diagnosis not present

## 2020-11-19 DIAGNOSIS — M810 Age-related osteoporosis without current pathological fracture: Secondary | ICD-10-CM | POA: Diagnosis not present

## 2020-11-19 DIAGNOSIS — H269 Unspecified cataract: Secondary | ICD-10-CM | POA: Diagnosis not present

## 2020-11-19 DIAGNOSIS — E785 Hyperlipidemia, unspecified: Secondary | ICD-10-CM | POA: Diagnosis not present

## 2020-11-19 DIAGNOSIS — Z0000 Encounter for general adult medical examination without abnormal findings: Secondary | ICD-10-CM | POA: Diagnosis not present

## 2020-11-19 DIAGNOSIS — K589 Irritable bowel syndrome without diarrhea: Secondary | ICD-10-CM | POA: Diagnosis not present

## 2020-11-19 DIAGNOSIS — E039 Hypothyroidism, unspecified: Secondary | ICD-10-CM | POA: Diagnosis not present

## 2020-11-19 DIAGNOSIS — Z1331 Encounter for screening for depression: Secondary | ICD-10-CM | POA: Diagnosis not present

## 2020-11-19 DIAGNOSIS — Z1339 Encounter for screening examination for other mental health and behavioral disorders: Secondary | ICD-10-CM | POA: Diagnosis not present

## 2020-11-19 DIAGNOSIS — R82998 Other abnormal findings in urine: Secondary | ICD-10-CM | POA: Diagnosis not present

## 2020-11-19 DIAGNOSIS — Z853 Personal history of malignant neoplasm of breast: Secondary | ICD-10-CM | POA: Diagnosis not present

## 2020-11-19 DIAGNOSIS — Z23 Encounter for immunization: Secondary | ICD-10-CM | POA: Diagnosis not present

## 2020-11-19 DIAGNOSIS — K219 Gastro-esophageal reflux disease without esophagitis: Secondary | ICD-10-CM | POA: Diagnosis not present

## 2020-11-19 DIAGNOSIS — Z86010 Personal history of colonic polyps: Secondary | ICD-10-CM | POA: Diagnosis not present

## 2020-11-25 DIAGNOSIS — Z01419 Encounter for gynecological examination (general) (routine) without abnormal findings: Secondary | ICD-10-CM | POA: Diagnosis not present

## 2020-11-25 DIAGNOSIS — Z6828 Body mass index (BMI) 28.0-28.9, adult: Secondary | ICD-10-CM | POA: Diagnosis not present

## 2020-11-28 DIAGNOSIS — H16223 Keratoconjunctivitis sicca, not specified as Sjogren's, bilateral: Secondary | ICD-10-CM | POA: Diagnosis not present

## 2020-11-28 DIAGNOSIS — H52203 Unspecified astigmatism, bilateral: Secondary | ICD-10-CM | POA: Diagnosis not present

## 2020-11-28 DIAGNOSIS — H43813 Vitreous degeneration, bilateral: Secondary | ICD-10-CM | POA: Diagnosis not present

## 2020-11-28 DIAGNOSIS — H18513 Endothelial corneal dystrophy, bilateral: Secondary | ICD-10-CM | POA: Diagnosis not present

## 2020-11-28 DIAGNOSIS — Z961 Presence of intraocular lens: Secondary | ICD-10-CM | POA: Diagnosis not present

## 2020-11-28 DIAGNOSIS — H5202 Hypermetropia, left eye: Secondary | ICD-10-CM | POA: Diagnosis not present

## 2020-11-28 DIAGNOSIS — H5211 Myopia, right eye: Secondary | ICD-10-CM | POA: Diagnosis not present

## 2020-11-28 DIAGNOSIS — Z83511 Family history of glaucoma: Secondary | ICD-10-CM | POA: Diagnosis not present

## 2020-11-28 DIAGNOSIS — H524 Presbyopia: Secondary | ICD-10-CM | POA: Diagnosis not present

## 2020-12-31 DIAGNOSIS — Z86010 Personal history of colonic polyps: Secondary | ICD-10-CM | POA: Diagnosis not present

## 2020-12-31 DIAGNOSIS — Z1211 Encounter for screening for malignant neoplasm of colon: Secondary | ICD-10-CM | POA: Diagnosis not present

## 2021-01-31 DIAGNOSIS — L84 Corns and callosities: Secondary | ICD-10-CM | POA: Diagnosis not present

## 2021-01-31 DIAGNOSIS — D225 Melanocytic nevi of trunk: Secondary | ICD-10-CM | POA: Diagnosis not present

## 2021-01-31 DIAGNOSIS — Z1283 Encounter for screening for malignant neoplasm of skin: Secondary | ICD-10-CM | POA: Diagnosis not present

## 2021-03-04 DIAGNOSIS — Z0000 Encounter for general adult medical examination without abnormal findings: Secondary | ICD-10-CM | POA: Diagnosis not present

## 2021-03-04 DIAGNOSIS — E785 Hyperlipidemia, unspecified: Secondary | ICD-10-CM | POA: Diagnosis not present

## 2021-05-03 DIAGNOSIS — Z23 Encounter for immunization: Secondary | ICD-10-CM | POA: Diagnosis not present

## 2021-09-04 ENCOUNTER — Other Ambulatory Visit: Payer: Self-pay | Admitting: Internal Medicine

## 2021-09-04 DIAGNOSIS — Z1231 Encounter for screening mammogram for malignant neoplasm of breast: Secondary | ICD-10-CM

## 2021-10-06 ENCOUNTER — Ambulatory Visit
Admission: RE | Admit: 2021-10-06 | Discharge: 2021-10-06 | Disposition: A | Discharge status: Home / Self Care | Payer: Medicare Other | Source: Ambulatory Visit | Attending: Internal Medicine | Admitting: Internal Medicine

## 2021-10-06 DIAGNOSIS — Z1231 Encounter for screening mammogram for malignant neoplasm of breast: Secondary | ICD-10-CM

## 2022-07-30 DIAGNOSIS — K219 Gastro-esophageal reflux disease without esophagitis: Secondary | ICD-10-CM | POA: Diagnosis not present

## 2022-07-30 DIAGNOSIS — R197 Diarrhea, unspecified: Secondary | ICD-10-CM | POA: Diagnosis not present

## 2022-08-19 DIAGNOSIS — Z006 Encounter for examination for normal comparison and control in clinical research program: Secondary | ICD-10-CM | POA: Diagnosis not present

## 2022-08-19 DIAGNOSIS — H18511 Endothelial corneal dystrophy, right eye: Secondary | ICD-10-CM | POA: Diagnosis not present

## 2022-08-27 DIAGNOSIS — R69 Illness, unspecified: Secondary | ICD-10-CM | POA: Diagnosis not present

## 2022-09-07 ENCOUNTER — Other Ambulatory Visit: Payer: Self-pay | Admitting: Internal Medicine

## 2022-09-07 DIAGNOSIS — Z1231 Encounter for screening mammogram for malignant neoplasm of breast: Secondary | ICD-10-CM

## 2022-09-25 DIAGNOSIS — R69 Illness, unspecified: Secondary | ICD-10-CM | POA: Diagnosis not present

## 2022-10-22 ENCOUNTER — Ambulatory Visit
Admission: RE | Admit: 2022-10-22 | Discharge: 2022-10-22 | Disposition: A | Discharge status: Home / Self Care | Payer: Medicare HMO | Source: Ambulatory Visit | Attending: Internal Medicine | Admitting: Internal Medicine

## 2022-10-22 DIAGNOSIS — Z1231 Encounter for screening mammogram for malignant neoplasm of breast: Secondary | ICD-10-CM

## 2022-10-26 DIAGNOSIS — R69 Illness, unspecified: Secondary | ICD-10-CM | POA: Diagnosis not present

## 2022-11-25 DIAGNOSIS — K219 Gastro-esophageal reflux disease without esophagitis: Secondary | ICD-10-CM | POA: Diagnosis not present

## 2022-11-25 DIAGNOSIS — E785 Hyperlipidemia, unspecified: Secondary | ICD-10-CM | POA: Diagnosis not present

## 2022-11-25 DIAGNOSIS — R69 Illness, unspecified: Secondary | ICD-10-CM | POA: Diagnosis not present

## 2022-11-25 DIAGNOSIS — M810 Age-related osteoporosis without current pathological fracture: Secondary | ICD-10-CM | POA: Diagnosis not present

## 2022-11-25 DIAGNOSIS — E039 Hypothyroidism, unspecified: Secondary | ICD-10-CM | POA: Diagnosis not present

## 2022-11-25 DIAGNOSIS — R7989 Other specified abnormal findings of blood chemistry: Secondary | ICD-10-CM | POA: Diagnosis not present

## 2022-12-02 DIAGNOSIS — E039 Hypothyroidism, unspecified: Secondary | ICD-10-CM | POA: Diagnosis not present

## 2022-12-02 DIAGNOSIS — Z1339 Encounter for screening examination for other mental health and behavioral disorders: Secondary | ICD-10-CM | POA: Diagnosis not present

## 2022-12-02 DIAGNOSIS — M810 Age-related osteoporosis without current pathological fracture: Secondary | ICD-10-CM | POA: Diagnosis not present

## 2022-12-02 DIAGNOSIS — Z1331 Encounter for screening for depression: Secondary | ICD-10-CM | POA: Diagnosis not present

## 2022-12-02 DIAGNOSIS — Z853 Personal history of malignant neoplasm of breast: Secondary | ICD-10-CM | POA: Diagnosis not present

## 2022-12-02 DIAGNOSIS — Z0000 Encounter for general adult medical examination without abnormal findings: Secondary | ICD-10-CM | POA: Diagnosis not present

## 2022-12-02 DIAGNOSIS — K589 Irritable bowel syndrome without diarrhea: Secondary | ICD-10-CM | POA: Diagnosis not present

## 2022-12-02 DIAGNOSIS — K219 Gastro-esophageal reflux disease without esophagitis: Secondary | ICD-10-CM | POA: Diagnosis not present

## 2022-12-02 DIAGNOSIS — Z86010 Personal history of colonic polyps: Secondary | ICD-10-CM | POA: Diagnosis not present

## 2022-12-02 DIAGNOSIS — E785 Hyperlipidemia, unspecified: Secondary | ICD-10-CM | POA: Diagnosis not present

## 2022-12-26 DIAGNOSIS — R69 Illness, unspecified: Secondary | ICD-10-CM | POA: Diagnosis not present

## 2023-01-25 DIAGNOSIS — R69 Illness, unspecified: Secondary | ICD-10-CM | POA: Diagnosis not present

## 2023-02-25 DIAGNOSIS — R69 Illness, unspecified: Secondary | ICD-10-CM | POA: Diagnosis not present

## 2023-03-04 DIAGNOSIS — Z01419 Encounter for gynecological examination (general) (routine) without abnormal findings: Secondary | ICD-10-CM | POA: Diagnosis not present

## 2023-03-04 DIAGNOSIS — Z6827 Body mass index (BMI) 27.0-27.9, adult: Secondary | ICD-10-CM | POA: Diagnosis not present

## 2023-03-23 DIAGNOSIS — Z1283 Encounter for screening for malignant neoplasm of skin: Secondary | ICD-10-CM | POA: Diagnosis not present

## 2023-03-23 DIAGNOSIS — D225 Melanocytic nevi of trunk: Secondary | ICD-10-CM | POA: Diagnosis not present

## 2023-06-07 DIAGNOSIS — H18511 Endothelial corneal dystrophy, right eye: Secondary | ICD-10-CM | POA: Diagnosis not present

## 2023-06-07 DIAGNOSIS — H538 Other visual disturbances: Secondary | ICD-10-CM | POA: Diagnosis not present

## 2023-06-07 DIAGNOSIS — H18513 Endothelial corneal dystrophy, bilateral: Secondary | ICD-10-CM | POA: Diagnosis not present

## 2023-06-08 DIAGNOSIS — Z947 Corneal transplant status: Secondary | ICD-10-CM | POA: Diagnosis not present

## 2023-06-08 DIAGNOSIS — H18513 Endothelial corneal dystrophy, bilateral: Secondary | ICD-10-CM | POA: Diagnosis not present

## 2023-06-11 DIAGNOSIS — H18512 Endothelial corneal dystrophy, left eye: Secondary | ICD-10-CM | POA: Diagnosis not present

## 2023-06-11 DIAGNOSIS — Z947 Corneal transplant status: Secondary | ICD-10-CM | POA: Diagnosis not present

## 2023-06-14 DIAGNOSIS — Z947 Corneal transplant status: Secondary | ICD-10-CM | POA: Diagnosis not present

## 2023-06-14 DIAGNOSIS — H18512 Endothelial corneal dystrophy, left eye: Secondary | ICD-10-CM | POA: Diagnosis not present

## 2023-09-27 DIAGNOSIS — K080 Exfoliation of teeth due to systemic causes: Secondary | ICD-10-CM | POA: Diagnosis not present

## 2023-10-04 ENCOUNTER — Other Ambulatory Visit: Payer: Self-pay | Admitting: Internal Medicine

## 2023-10-04 DIAGNOSIS — Z1231 Encounter for screening mammogram for malignant neoplasm of breast: Secondary | ICD-10-CM

## 2023-10-26 ENCOUNTER — Ambulatory Visit
Admission: RE | Admit: 2023-10-26 | Discharge: 2023-10-26 | Disposition: A | Discharge status: Home / Self Care | Source: Ambulatory Visit | Attending: Internal Medicine | Admitting: Internal Medicine

## 2023-10-26 DIAGNOSIS — Z1231 Encounter for screening mammogram for malignant neoplasm of breast: Secondary | ICD-10-CM

## 2023-11-03 DIAGNOSIS — Z961 Presence of intraocular lens: Secondary | ICD-10-CM | POA: Diagnosis not present

## 2023-11-03 DIAGNOSIS — H18512 Endothelial corneal dystrophy, left eye: Secondary | ICD-10-CM | POA: Diagnosis not present

## 2023-11-03 DIAGNOSIS — Z947 Corneal transplant status: Secondary | ICD-10-CM | POA: Diagnosis not present

## 2023-11-30 DIAGNOSIS — E039 Hypothyroidism, unspecified: Secondary | ICD-10-CM | POA: Diagnosis not present

## 2023-11-30 DIAGNOSIS — M810 Age-related osteoporosis without current pathological fracture: Secondary | ICD-10-CM | POA: Diagnosis not present

## 2023-11-30 DIAGNOSIS — E785 Hyperlipidemia, unspecified: Secondary | ICD-10-CM | POA: Diagnosis not present

## 2023-12-07 DIAGNOSIS — Z1339 Encounter for screening examination for other mental health and behavioral disorders: Secondary | ICD-10-CM | POA: Diagnosis not present

## 2023-12-07 DIAGNOSIS — Z0000 Encounter for general adult medical examination without abnormal findings: Secondary | ICD-10-CM | POA: Diagnosis not present

## 2023-12-07 DIAGNOSIS — Z1331 Encounter for screening for depression: Secondary | ICD-10-CM | POA: Diagnosis not present

## 2023-12-07 DIAGNOSIS — E039 Hypothyroidism, unspecified: Secondary | ICD-10-CM | POA: Diagnosis not present

## 2023-12-09 DIAGNOSIS — M810 Age-related osteoporosis without current pathological fracture: Secondary | ICD-10-CM | POA: Diagnosis not present

## 2023-12-21 DIAGNOSIS — H18512 Endothelial corneal dystrophy, left eye: Secondary | ICD-10-CM | POA: Diagnosis not present

## 2023-12-30 DIAGNOSIS — H538 Other visual disturbances: Secondary | ICD-10-CM | POA: Diagnosis not present

## 2023-12-30 DIAGNOSIS — H18512 Endothelial corneal dystrophy, left eye: Secondary | ICD-10-CM | POA: Diagnosis not present

## 2023-12-30 DIAGNOSIS — H18502 Unspecified hereditary corneal dystrophies, left eye: Secondary | ICD-10-CM | POA: Diagnosis not present

## 2023-12-31 DIAGNOSIS — Z961 Presence of intraocular lens: Secondary | ICD-10-CM | POA: Diagnosis not present

## 2023-12-31 DIAGNOSIS — Z48810 Encounter for surgical aftercare following surgery on the sense organs: Secondary | ICD-10-CM | POA: Diagnosis not present

## 2023-12-31 DIAGNOSIS — Z947 Corneal transplant status: Secondary | ICD-10-CM | POA: Diagnosis not present

## 2024-01-03 DIAGNOSIS — Z961 Presence of intraocular lens: Secondary | ICD-10-CM | POA: Diagnosis not present

## 2024-01-03 DIAGNOSIS — Z947 Corneal transplant status: Secondary | ICD-10-CM | POA: Diagnosis not present

## 2024-01-21 DIAGNOSIS — Z947 Corneal transplant status: Secondary | ICD-10-CM | POA: Diagnosis not present

## 2024-01-21 DIAGNOSIS — Z961 Presence of intraocular lens: Secondary | ICD-10-CM | POA: Diagnosis not present

## 2024-02-23 DIAGNOSIS — Z947 Corneal transplant status: Secondary | ICD-10-CM | POA: Diagnosis not present

## 2024-02-23 DIAGNOSIS — Z961 Presence of intraocular lens: Secondary | ICD-10-CM | POA: Diagnosis not present

## 2024-03-07 DIAGNOSIS — Z01419 Encounter for gynecological examination (general) (routine) without abnormal findings: Secondary | ICD-10-CM | POA: Diagnosis not present

## 2024-03-07 DIAGNOSIS — Z6828 Body mass index (BMI) 28.0-28.9, adult: Secondary | ICD-10-CM | POA: Diagnosis not present

## 2024-04-06 DIAGNOSIS — K080 Exfoliation of teeth due to systemic causes: Secondary | ICD-10-CM | POA: Diagnosis not present

## 2024-04-26 DIAGNOSIS — Z947 Corneal transplant status: Secondary | ICD-10-CM | POA: Diagnosis not present

## 2024-04-26 DIAGNOSIS — Z961 Presence of intraocular lens: Secondary | ICD-10-CM | POA: Diagnosis not present
